# Patient Record
Sex: Female | Born: 1935 | Race: White | Hispanic: No | Smoking: Never smoker
Health system: Southern US, Community
[De-identification: ages and names within clinical notes are randomized; demographics above are authoritative.]

## PROBLEM LIST (undated history)

## (undated) DIAGNOSIS — E119 Type 2 diabetes mellitus without complications: Secondary | ICD-10-CM

## (undated) DIAGNOSIS — H543 Unqualified visual loss, both eyes: Secondary | ICD-10-CM

## (undated) DIAGNOSIS — K219 Gastro-esophageal reflux disease without esophagitis: Secondary | ICD-10-CM

## (undated) DIAGNOSIS — I10 Essential (primary) hypertension: Secondary | ICD-10-CM

## (undated) DIAGNOSIS — J449 Chronic obstructive pulmonary disease, unspecified: Secondary | ICD-10-CM

## (undated) DIAGNOSIS — N390 Urinary tract infection, site not specified: Secondary | ICD-10-CM

## (undated) DIAGNOSIS — M1990 Unspecified osteoarthritis, unspecified site: Secondary | ICD-10-CM

## (undated) DIAGNOSIS — E7800 Pure hypercholesterolemia, unspecified: Secondary | ICD-10-CM

## (undated) DIAGNOSIS — G459 Transient cerebral ischemic attack, unspecified: Secondary | ICD-10-CM

## (undated) DIAGNOSIS — I639 Cerebral infarction, unspecified: Secondary | ICD-10-CM

## (undated) HISTORY — PX: FRACTURE SURGERY: SHX138

## (undated) HISTORY — PX: CATARACT EXTRACTION W/ INTRAOCULAR LENS  IMPLANT, BILATERAL: SHX1307

## (undated) HISTORY — PX: RETINAL DETACHMENT SURGERY: SHX105

## (undated) HISTORY — PX: EYE SURGERY: SHX253

---
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
## 1999-06-14 ENCOUNTER — Inpatient Hospital Stay (HOSPITAL_COMMUNITY): Admission: AD | Admit: 1999-06-14 | Discharge: 1999-06-15 | Payer: Self-pay | Admitting: Endocrinology

## 1999-06-14 ENCOUNTER — Encounter: Payer: Self-pay | Admitting: Endocrinology

## 1999-06-23 ENCOUNTER — Ambulatory Visit (HOSPITAL_COMMUNITY): Admission: RE | Admit: 1999-06-23 | Discharge: 1999-06-23 | Payer: Self-pay | Admitting: Endocrinology

## 2003-03-15 HISTORY — PX: ERCP: SHX60

## 2003-03-15 HISTORY — PX: LAPAROSCOPIC CHOLECYSTECTOMY: SUR755

## 2003-03-22 ENCOUNTER — Encounter: Payer: Self-pay | Admitting: Emergency Medicine

## 2003-03-22 ENCOUNTER — Encounter: Payer: Self-pay | Admitting: Endocrinology

## 2003-03-22 ENCOUNTER — Inpatient Hospital Stay (HOSPITAL_COMMUNITY): Admission: EM | Admit: 2003-03-22 | Discharge: 2003-03-26 | Payer: Self-pay | Admitting: Emergency Medicine

## 2003-03-23 ENCOUNTER — Encounter: Payer: Self-pay | Admitting: Gastroenterology

## 2003-03-24 ENCOUNTER — Encounter (INDEPENDENT_AMBULATORY_CARE_PROVIDER_SITE_OTHER): Payer: Self-pay | Admitting: *Deleted

## 2003-03-24 ENCOUNTER — Encounter: Payer: Self-pay | Admitting: Endocrinology

## 2004-08-14 HISTORY — PX: ORIF ANKLE FRACTURE: SUR919

## 2004-08-30 ENCOUNTER — Inpatient Hospital Stay (HOSPITAL_COMMUNITY): Admission: EM | Admit: 2004-08-30 | Discharge: 2004-09-02 | Payer: Self-pay | Admitting: Emergency Medicine

## 2004-09-13 ENCOUNTER — Inpatient Hospital Stay (HOSPITAL_COMMUNITY): Admission: RE | Admit: 2004-09-13 | Discharge: 2004-09-16 | Payer: Self-pay | Admitting: Specialist

## 2004-10-14 HISTORY — PX: ORIF ANKLE FRACTURE BIMALLEOLAR: SUR920

## 2005-10-10 ENCOUNTER — Encounter: Payer: Self-pay | Admitting: Emergency Medicine

## 2005-10-11 ENCOUNTER — Inpatient Hospital Stay (HOSPITAL_COMMUNITY): Admission: AD | Admit: 2005-10-11 | Discharge: 2005-10-14 | Payer: Self-pay | Admitting: Endocrinology

## 2005-10-14 HISTORY — PX: INCISION AND DRAINAGE: SHX5863

## 2008-04-28 ENCOUNTER — Ambulatory Visit (HOSPITAL_COMMUNITY): Admission: RE | Admit: 2008-04-28 | Discharge: 2008-04-28 | Payer: Self-pay | Admitting: Ophthalmology

## 2010-03-14 HISTORY — PX: PARS PLANA VITRECTOMY: SHX2166

## 2010-03-17 ENCOUNTER — Ambulatory Visit (HOSPITAL_COMMUNITY): Admission: RE | Admit: 2010-03-17 | Discharge: 2010-03-17 | Payer: Self-pay | Admitting: Ophthalmology

## 2010-07-09 ENCOUNTER — Ambulatory Visit (HOSPITAL_COMMUNITY): Admission: RE | Admit: 2010-07-09 | Discharge: 2010-07-09 | Payer: Self-pay | Admitting: Ophthalmology

## 2010-10-14 HISTORY — PX: GLAUCOMA SURGERY: SHX656

## 2010-11-12 ENCOUNTER — Ambulatory Visit (HOSPITAL_COMMUNITY)
Admission: RE | Admit: 2010-11-12 | Discharge: 2010-11-12 | Payer: Self-pay | Source: Home / Self Care | Attending: Ophthalmology | Admitting: Ophthalmology

## 2011-01-24 LAB — SURGICAL PCR SCREEN
MRSA, PCR: NEGATIVE
Staphylococcus aureus: POSITIVE — AB

## 2011-01-24 LAB — BASIC METABOLIC PANEL
Calcium: 9.2 mg/dL (ref 8.4–10.5)
GFR calc Af Amer: 54 mL/min — ABNORMAL LOW (ref >60)
GFR calc non Af Amer: 45 mL/min — ABNORMAL LOW (ref >60)
Potassium: 4.2 mEq/L (ref 3.5–5.1)
Sodium: 136 mEq/L (ref 135–145)

## 2011-01-24 LAB — GLUCOSE, CAPILLARY
Glucose-Capillary: 231 mg/dL — ABNORMAL HIGH (ref 70–99)
Glucose-Capillary: 242 mg/dL — ABNORMAL HIGH (ref 70–99)
Glucose-Capillary: 276 mg/dL — ABNORMAL HIGH (ref 70–99)

## 2011-01-24 LAB — CBC
Hemoglobin: 12.5 g/dL (ref 12.0–15.0)
MCHC: 33.4 g/dL (ref 30.0–36.0)
RBC: 4.15 MIL/uL (ref 3.87–5.11)
WBC: 5.7 10*3/uL (ref 4.0–10.5)

## 2011-01-27 LAB — GLUCOSE, CAPILLARY: Glucose-Capillary: 220 mg/dL — ABNORMAL HIGH (ref 70–99)

## 2011-01-27 LAB — BASIC METABOLIC PANEL
BUN: 22 mg/dL (ref 6–23)
Calcium: 9.2 mg/dL (ref 8.4–10.5)
Chloride: 101 mEq/L (ref 96–112)
Creatinine, Ser: 1.1 mg/dL (ref 0.4–1.2)

## 2011-01-27 LAB — CBC
MCV: 89.8 fL (ref 78.0–100.0)
Platelets: 159 10*3/uL (ref 150–400)
RDW: 13 % (ref 11.5–15.5)
WBC: 7.2 10*3/uL (ref 4.0–10.5)

## 2011-01-27 LAB — APTT: aPTT: 32 seconds (ref 24–37)

## 2011-02-01 LAB — BASIC METABOLIC PANEL
BUN: 22 mg/dL (ref 6–23)
Chloride: 101 mEq/L (ref 96–112)
GFR calc Af Amer: >60 mL/min (ref >60)
Potassium: 3.5 mEq/L (ref 3.5–5.1)

## 2011-02-01 LAB — CBC
HCT: 40.9 % (ref 36.0–46.0)
MCV: 91.9 fL (ref 78.0–100.0)
RBC: 4.45 MIL/uL (ref 3.87–5.11)
WBC: 7.6 10*3/uL (ref 4.0–10.5)

## 2011-02-01 LAB — GLUCOSE, CAPILLARY
Glucose-Capillary: 162 mg/dL — ABNORMAL HIGH (ref 70–99)
Glucose-Capillary: 260 mg/dL — ABNORMAL HIGH (ref 70–99)

## 2011-03-29 NOTE — Op Note (Signed)
NAME:  Julia Oliver, Julia Oliver NO.:  [id number]   MEDICAL RECORD NO.:  [id number]          PATIENT TYPE:  AMB   LOCATION:  SDS                          FACILITY:  [facility]   PHYSICIAN:  Alford Highland. Rankin, M.D.   DATE OF BIRTH:  November 04, 1920   DATE OF PROCEDURE:  04/28/2008  DATE OF DISCHARGE:                               OPERATIVE REPORT   PREOPERATIVE DIAGNOSES:  1. Tractional detachment of the right eye.  2. Epiretinal membrane of the right eye.  3. Progressive proliferative diabetic retinopathy of the right eye.   POSTOPERATIVE DIAGNOSES:  1. Tractional detachment of the right eye.  2. Epiretinal membrane of the right eye.  3. Progressive proliferative diabetic retinopathy of the right eye.   PROCEDURES:  1. Posterior vitrectomy of epiretinal membrane and tractional      detachment of the right eye - 25 gauge.  2. Endolaser panphotocoagulation, right eye.  3. Injection vitreous substitute - SF6 20%.   SURGEON:  Alford Highland. Rankin, M.D.   ANESTHESIA:  Local with monitored anesthesia control.   INDICATION FOR PROCEDURE:  The patient is an 77 year old woman who has  profound vision loss in the right eye to the level of about 20/300s on  the basis of multifocal tractional detachment with two sites within the  macular region, and one immediately adjacent temporal to the fovea  involving the macula in addition to a shallow detachment adjacent to the  optic nerve.  The patient says it is an attempt to release the  tractional changes so as to allow healing, and the retina was flattened  to allow the best chance for visual acuity stabilization and hopefully  improvement.  The patient understands the risks of anesthesia  including  rate occurrence of death, loss of the eye including from the underlying  condition as well as surgical repair, but not limited to hemorrhage,  infection, scarring, need for another surgery, no change in vision, loss  of vision, and progressive disease  despite intervention.   PROCEDURE IN DETAIL:  Appropriate signed consent was obtained. The  patient was taken to the operating room.  In the operating room,  appropriate monitors followed by mild sedation.  Xylocaine 2% was  injected retrobulbar to the right eye, 5 mL with an additional 4 mL  injected laterally in the fashion of modified [name].  The right  periocular region was sterilely prepped and draped in the usual sterile  fashion.  A lid speculum was applied.  A 25-gauge trocar was placed on  the inferotemporal quadrant.  A superior trocar was applied.  Core  traction was then begun.  Modified en bloc technique with the small 25-  gauge vitrectomy allowed for removal of the dense fibrous tissue and  traction on the optic nerve and more importantly over the foveal macular  region.  Tractional changes __________ were also found.  Small retinal  holes throughout the base of the fibrous disease nasally.  The  dissection was then carried out with the posterior hyaloid and down to  the anterior to the equator trimming it 360 degrees.  Retrolental  hyaloid was  confirmed removed.  At this time, fluid air exchange was  completed.  The little small nasal retinal detachment settled nicely as  the fluid was extruded.  Endolaser photocoagulation placed in the bed of  the this localized detachment nasal to the optic nerve as well as  superiorly, and it has not been previously treated.  A  decision was made to place SF6 gas.  A 20% SF6 gas was exchanged for the  air.  Trocars were removed.  Subconjunctival Decadron applied.  Sterile  patch and Fox shield applied.  The patient tolerated the procedure well  without complications.      Alford Highland Rankin, M.D.  Electronically Signed     GAR/MEDQ  D:  04/28/2008  T:  04/28/2008  Job:  [id number]   cc:   Ladoris Gene, MD

## 2011-04-01 NOTE — H&P (Signed)
NAME:  Julia Oliver, KOOK NO.:  [id number]   MEDICAL RECORD NO.:  [id number]                   PATIENT TYPE:  EMS   LOCATION:  MAJO                                 FACILITY:  [facility]   PHYSICIAN:  Reather Littler, M.D.                    DATE OF BIRTH:  [birth year]   DATE OF ADMISSION:  03/22/2003  DATE OF DISCHARGE:                                HISTORY & PHYSICAL   CHIEF COMPLAINT:  Episodes of chest pain.   HISTORY:  This patient apparently was well until this afternoon, when she  experienced a discomfort in her lower chest across the whole chest which she  says was under her breast area.  She says that this pain was hurting in  nature and apparently it spread to her neck and her spine in the back.  The  patient said that she also experienced an aching feeling in the chest area.  She did not think she was having abdominal discomfort.  She tried to take  Tums but initially it may have helped a little.  Subsequently, the patient  started having nausea and vomited and the pain subsided.  Apparently, the  pain recurred three times with the same symptoms and today, she has had two  episodes of nausea and vomiting.  She claims that the pains lasted only two  to three minutes each time and then by the time she was transported to the  emergency room by ambulance, the pain had subsided.  She tried Tums a second  time for the pain but it did not relieve her.  She thinks she was also  having a difficult time breathing.  She did not have any sweating or  lightheadedness.  She has not had any chills or fever.  She has been able to  eat normally.  No change in her bowel movements.   The patient was not given any nitroglycerin.  She was apparently feeling  somewhat less short of breath with administration of oxygen by the EMS.   PAST MEDICAL HISTORY:  She has had no significant history except for  appendectomy.  She apparently had a hospital visit about five years ago  but  she did not know whether it was a light stroke.  Her medical record is not  available currently.   PERSONAL HISTORY:  She lives with her husband.  She is a nonsmoker.   FAMILY HISTORY:  Family history not contributory.   REVIEW OF SYSTEMS:  The patient has had diabetes for 38 years, taking  Diabinese for several years and for the last three years, taking insulin;  she currently takes 32 units in the morning and 16 in the evening of 75/25  insulin.  She also takes 81 mg aspirin and Zocor for hypercholesterolemia.  She has had no history of hypertension, CVA, MI.  She does not complain of  any numbness in her feet.  She has mild indigestion relieved by Tums after  her evening meal.   PHYSICAL EXAMINATION:  GENERAL:  The patient is heavily built and nourished.  VITAL SIGNS:  Her blood pressure is 180/78.  Her pulse is 96.  Temperature  is normal.  Respirations are normal.  HEENT:  Eyes:  There is no pallor.  Mucous membranes are slightly dry.  NECK:  No thyroid enlargement.  No carotid bruits.  HEART:  Heart sounds are normal.  LUNGS:  Lungs are clear.  ABDOMEN:  She has mild nonspecific epigastric and left lower quadrant  tenderness.  She has no spine tenderness or flank tenderness.  EXTREMITIES:  She has 1+ edema.   LABORATORY DATA:  Labs significant for increased ALT of 209 and AST of 489  and lipase of 69, bilirubin of 2.1.  Her electrolytes and white count are  normal.  Cardiac enzymes are normal.   EKG shows mild ST changes.   ASSESSMENT:  Patient appears likely to have had an episode of biliary colic  but possibly some pancreatitis.   PLAN:  The patient will need to have a diagnostic ultrasound of her right  upper quadrant and surgical consultation if positive.                                               Reather Littler, M.D.    AK/MEDQ  D:  03/22/2003  T:  03/24/2003  Job:  [id number]   cc:   Alfonse Alpers. Dagoberto Ligas, M.D.  [address], [address]  [place]  [place]  [address]  Fax: [phone]

## 2011-04-01 NOTE — Op Note (Signed)
NAME:  Julia Oliver, Julia Oliver NO.:  [id number]   MEDICAL RECORD NO.:  [id number]          PATIENT TYPE:  REC   LOCATION:  FOOT                         FAC[facility]   PHYSICIAN:  Jene Every, M.D.    DATE OF BIRTH:  1920-05-03   DATE OF PROCEDURE:  10/20/2004  DATE OF DISCHARGE:                                 OPERATIVE REPORT   PREOPERATIVE DIAGNOSIS:  Fracture dislocation of the right ankle.   POSTOPERATIVE DIAGNOSIS:  1.  Fracture dislocation of the right ankle.  2.  Venous stasis ulcer of the distal leg.   OPERATION/PROCEDURE:  1.  Irrigation and debridement of the venous stasis, decubitus ulcer of the      leg.  2.  Open reduction and internal fixation from bimalleolar ankle fracture.   ANESTHESIA:  General.   SURGEON:  Jene Every, M.D.   ASSISTANT:  Roma Schanz, P.A.-C.   INDICATIONS:  This is an 77 year old, status post closed reduction of a  bimalleolar fracture dislocation of the ankle.  She was in a splint for  reduction of soft tissue swelling.  Operative intervention is indicated for  internal fixation.  Risks and benefits discussed including bleeding,  infection, damage to vascular structures, no change in symptoms, worsening  of symptoms, hardware failure, need for revision, nonunion, malunion, etc.   TECHNIQUE:  With the patient in the supine position, after induction of  adequate general anesthesia and 1 gram of Kefzol, splint was removed.  The  leg was inspected.  There was a venous stasis __________ decubitus ulcer  above the medial malleolus felt to be from avulsion from the initial injury.  This was not near the fracture site.  I debrided the edges of this,  approximately 2 x 1 cm, debrided the edges to bleeding tissue.  There was no  exposed bone.  I covered it with a 4 x 4.   Next, due to comminution of the distal fibula, we made a small incision over  the distal aspect of the fibula and utilizing a Rush rod, intramedullary  to  hold the fracture and prevent lateral translation.  Therefore, used a 4 mm  Rush rod, drilled a hole in the distal fibula and advanced it intramedullary  into the fibula with good reduction of the fibula fracture.  Next, in an  effort to avoid the decubitus wound or the venous stasis wound over the  medial malleolus, I reduced it closed.  The medial malleolus held with a  tenaculum, placed two percutaneous incisions, and then advanced two  partially threaded cancellous screws up in the medial malleolus with  anatomic reduction.  After __________ drilling, depth gauge measurement,  insertion of the screws, appeared excellent purchase was obtained despite  osteoporotic bone.  Both wounds were copiously irrigated and I closed each  with 4-0 nylon simple sutures.  Under x-ray, there was restoration of the  mortise in the AP and lateral plane.  The decubitus was dressed sterilely  Xeroform, 4 x 4's.  Placed her into a short-leg cast, well molded.  Tourniquet was not utilized.  She was placed in a short-leg, well  padded  cast in the neutral position.  The patient was then awakened without  difficulty and transported to the recovery room.  The patient tolerated the  procedure well. There were no complications.     Trey Paula   JB/MEDQ  D:  10/20/2004  T:  10/21/2004  Job:  [id number]

## 2011-04-01 NOTE — Op Note (Signed)
NAME:  Julia Oliver, Julia Oliver NO.:  [id number]   MEDICAL RECORD NO.:  [id number]                   PATIENT TYPE:  INP   LOCATION:  5024                                 FACILITY:  [facility]   PHYSICIAN:  Bernette Redbird, M.D.                DATE OF BIRTH:  1920/05/06   DATE OF PROCEDURE:  03/23/2003  DATE OF DISCHARGE:                                 OPERATIVE REPORT   PROCEDURE:  ERCP (attempted).   INDICATION:  An 77 year old female, who presented to the hospital with upper  abdominal pain radiating through to her back, multiple small gallstones on  ultrasound, a borderline enlarged common bile duct, and elevated liver  chemistries which have risen overnight.   FINDINGS:  Unable to cannulate the common bile duct.  The major papilla was  very small.  A guidewire was able to be passed into the pancreatic duct.   DESCRIPTION OF PROCEDURE:  The nature, purpose, and risks of the procedure  had been carefully discussed with the patient and her daughter, and she  provided written consent.  She was brought from her hospital room to the  endoscopy unit following intravenous Rocephin 1 g as antibiotic prophylaxis.  Sedation prior to and during the course of the exam totaled fentanyl 70 mcg  and Versed 7 mg IV.  No Glucagon was administered.  The patient's CBG prior  to the procedure was 158 (she is diabetic).   The Olympus new small-caliber therapeutic video duodenoscope was passed  easily into the esophagus blindly and advanced down into the stomach which  had a normal appearance except for a small hiatal hernia seen on retroflexed  viewing.  Specifically, I saw no evidence of gastritis, erosions, ulcers,  polyps, or masses on a fairly complete view of the stomach.   The stomach did appear to be somewhat enlarged, and it was difficult to get  the scope to traverse the pylorus, but this was finally accomplished after  several minutes of trying.   It was then  somewhat difficult to get the scope to advance down into the  duodenum because of looping in the stomach.  By shifting the patient from  the prone position to the prone oblique position and ultimately the left  lateral decubitus position, I was finally able to get fairly good intubation  of the duodenum but still could not identify the major papilla.   Finally, we placed the patient into the supine position and with careful  attention to mouth suctioning because she had a fair amount of secretions,  burping, and some regurgitation (but no evidence of aspiration), I was able  to get a somewhat better orientation and view of the second portion of the  duodenum.   I was able to identify what I believe was the minor papilla and then this  guided me to the major papilla.  The major papilla was barely visible as a  minimal  elevation of mucosa around the surrounding mucosa with a hint of the  characteristic longitudinal fold, but certainly this was not at all  prominent.  There was certainly no punctum, nor any bile in the duodenal  lumen nor any drainage of bile from this area during the course of the  procedure.  Nevertheless, we attempted blind cannulation and after a period  of trying, we were able to get the guidewire to advance, and fluoroscopy  showed this to cross the midline, compatible with entry into the pancreatic  duct.  Since there was no indication for a pancreatogram, instillation of  contrast was not performed.  We then made some further efforts to obtain  cannulation of the common bile duct, but these were not successful.  Ultimately, since the procedure had already been going on about an hour and  10 minutes and I was concerned about the possibility of aspiration with the  patient in the supine position, and since it was felt that there really was  not a sufficient sphincter muscle anatomically present to permit a generous  sphincterotomy for stone extraction anyway, that  further attempts at  cannulation of the common duct would probably not be helpful to the patient  and might increase the risk of complications such as pancreatitis, so the  procedure was terminated at this point.  Prior to removal of the scope, I  suctioned additional fluid out of the stomach.  The patient was then placed  back into the left lateral decubitus position.  In recovery, she was stable  without distress.  She tolerated the procedure overall very well, and there  were no apparent complications.   IMPRESSION:  1. Very small major papillae, as described above.  2. Pancreatic duct cannulated with guidewire but not injected with contrast.  3. Unable to cannulate the common bile duct despite prolonged efforts, as     described above.   RECOMMENDATIONS:  Due to the tiny size of this patient's major papillae, I  do not believe she is a good sphincterotomy candidate.  Even if cannulation  could be achieved (for example, by leaving a guidewire down the common duct  during surgery, or by further attempts at standard ERCP), I do not think  that a sufficient cut could be made on the end of the bile duct to allow  extraction of any significant sized stones.  Therefore, I would favor  proceeding to operative management including, if necessary, an open  cholecystectomy with common bile duct exploration.   I have discussed these findings with Jimmye Norman, M.D. and with Reather Littler,  M.D. as well as with the patient's daughter.                                               Bernette Redbird, M.D.    RB/MEDQ  D:  03/23/2003  T:  03/24/2003  Job:  [id number]   cc:   Reather Littler, M.D.  [address]  [place]  [place] [address]  Fax: [phone]   Jimmye Norman III, M.D.  [phone] [address]  [place]  [place] [address]  Fax: [phone]

## 2011-04-01 NOTE — Consult Note (Signed)
NAME:  Julia Oliver, QUIZON NO.:  [id number]   MEDICAL RECORD NO.:  [id number]                   PATIENT TYPE:  INP   LOCATION:  5024                                 FACILITY:  [facility]   PHYSICIAN:  Bernette Redbird, M.D.                DATE OF BIRTH:  May 23, 1920   DATE OF CONSULTATION:  03/23/2003  DATE OF DISCHARGE:                                   CONSULTATION   Dr. Lucianne Muss, covering for Dr. Dagoberto Ligas, asked me to see this 77 year old female  because of biliary tract disease.   HISTORY:  The patient was admitted to the hospital yesterday after an  episode of severe xyphoid region epigastric pain radiating through to the  back which began yesterday afternoon.  She had similar milder episodes in  the past, but none as severe as this.  She had vomiting.  No definite fevers  or chills, possibly some dark urine.  Ultrasound in the emergency room  showed cholelithiasis and a 6.3 mm common duct without definite  choledocholithiasis and without gallbladder wall thickening, but the patient  had significant elevation of liver chemistries (see below), minimal  elevation of lipase and accordingly I was asked to see the patient.   There is a family history of gallbladder disease in her mother.   The patient is a longstanding diabetic.   ALLERGIES:  1. NOVOCAINE (Apparently passed out during dental work, doubt it was really     due to the Novocaine, this was many years ago).  2. PENICILLIN (reaction).  3. SULFA DRUGS (GI upset).   OUTPATIENT MEDICATIONS:  1. Humalog insulin 75/25 32 units each  morning and 16 units each evening.  2. Zocor.  3. Daily baby aspirin.   OPERATIONS:  Remote appendectomy, cataract surgery.   CHRONIC MEDICAL ILLNESSES:  Type 2 diabetes of 30 years duration, on insulin  for the past five years or so.  No known cardiopulmonary disease or  hypertension.   HABITS:  Lifelong nonsmoker, nondrinker.   FAMILY HISTORY:  Gallbladder disease in  her mother.  Colon cancer and ulcers  in her sister.  The colon cancer was diagnosed at an age close to where the  patient is now.   SOCIAL HISTORY:  The patient is married.  A daughter,  Julia Oliver, accompanies  her today.   REVIEW OF SYSTEMS:  Chronic constipation, heartburn each day around 5 p.m.  treated with over-the-counter antacids, intermittent dysphagia, no anorexia,  weight loss, chronic stomach pain.  Occasional diarrhea.   PHYSICAL EXAMINATION:  GENERAL:  Pleasant healthy appearing female with  somewhat sallow complexion, alert and in no evident distress. Note, at this  time she has mild residual abdominal  soreness, but no ongoing pain.  VITAL SIGNS:  Temperature 98.2, heart rate 89, blood pressure 173/105 at  admission, respirations 18 and unlabored.  HEENT:  Mild conjunctival pallor.  No frank jaundice.  Oropharynx benign.  CHEST:  Clear  to auscultation.  HEART:  No gallops, rubs or murmurs, clicks or arrhythmias appreciated.  ABDOMEN:  Adipose.  Soft bowel sounds.  No organomegaly.  Mild tenderness in  the right lower abdominal region.  No peritoneal findings.  No mass affect.  No guarding.  No significant right upper quadrant tenderness.  RECTAL:  Not performed.   LABORATORY DATA:  White count 7800, hemoglobin 13.4 on admission, MCV 90,  RDW normal, platelets slightly low at 121,000, 90 polys, 5 lymphs, 5  monocytes.  INR 0.9.  Chemistry panel pertinent for glucose in the 230 to  280 range, renal function normal, bilirubin was 2.1 last night and is 2.9  this morning, alk phos was 94 last night and is 100 this morning, AST was  489 last night and is 585 today,  and ALT is 209 last night and 380 this  morning.  Last night, lipase was 69 and amylase was 96.  CK total was normal  and troponin I was negative.   The ultrasound report indicates multiple small gallstones in a 6.3 mm common  duct without definite choledocholithiasis or evidence of gallbladder wall   thickening.   IMPRESSION:  1. Probable choledocholithiasis based on elevated liver chemistries     (baseline liver chemistries not available).  2. Chololithiasis.  3. Abdominal pain, probably due to other #1 or #2.  4. No definite evidence of pancreatitis.  Minimal elevation of lipase with     normal amylase.  It is probably not likely significant.  5. Family history of colon cancer in her sister.  6. Tendency towards constipation.   PLAN:  1. ERCP today because of the rising liver chemistries.  The nature, purpose,     risks and alternatives have been very carefully discussed with the     patient and her daughter with the aid of a diagram and they are agreeable     to proceed.  2. Surgical consultation for consideration of cholecystectomy in view of the     documented chololithiasis, abdominal     symptoms and the fact that the patient is diabetic.  3. Consideration for elective outpatient colon cancer screening of some sort     in view of a family history of colon cancer.                                                 Bernette Redbird, M.D.    RB/MEDQ  D:  03/23/2003  T:  03/25/2003  Job:  [id number]   cc:   Alfonse Alpers. Dagoberto Ligas, M.D.  [address], [address]  [place]  [place] [address]  Fax: [phone]   Jimmye Norman III, M.D.  [phone], [address]  [place]  [place] [address]  Fax: [phone]

## 2011-04-01 NOTE — Discharge Summary (Signed)
NAME:  Julia Oliver, Julia Oliver NO.:  [id number]   MEDICAL RECORD NO.:  [id number]          PATIENT TYPE:  INP   LOCATION:  0480                         FACILITY:  [facility]   PHYSICIAN:  Jene Every, M.D.    DATE OF BIRTH:  02/28/1920   DATE OF ADMISSION:  09/13/2004  DATE OF DISCHARGE:  09/16/2004                                 DISCHARGE SUMMARY   ADMISSION DIAGNOSES:  1.  Fracture dislocation of right ankle.  2.  Insulin-dependent diabetes with poor control.  3.  Hypercholesterolemia.  4.  Remote history of transient ischemic attack.   DISCHARGE DIAGNOSES:  1.  Status post open reduction internal fixation of the right ankle;      fracture dislocation of right ankle.  2.  Insulin-dependent diabetes with poor control.  3.  Hypercholesterolemia.  4.  Remote history of transient ischemic attack.  5.  Asymptomatic postoperative anemia.  6.  Resolved hyponatremia.   PROCEDURE:  The patient was taken to the OR, underwent percutaneous open  reduction internal fixation of the right ankle by surgeon Dr. Jene Every,  assistant Roma Schanz, P.A. Also debridement of the ulcer on the  median aspect. Complications none. Anesthesia general.   CONSULTS:  PT/OT, Dr. Corrin Parker.   HISTORY:  Julia Oliver is an 77 year old female who fell at home October 17.  She was initially seen in the emergency room where Dr. Shelle Iron performed a  close reduction of her ankle fracture. She was admitted for a couple of days  and then released to a nursing facility to allow for soft tissue swelling to  decrease. We then saw her in the office. We discussed ORIF of the ankle. The  risks and benefits were discussed with the patient as well as the family,  and she wishes to proceed.   LABORATORY DATA:  CBC done postoperatively shows hemoglobin of 9.3,  hematocrit 27.9. This was followed during the hospital course. At the time  of discharge, hemoglobin 9.2, hematocrit 27.4 with a normal  white cell  count. Routine chemistries done showed sodium 135, potassium 4.1, glucose  142. These were followed during the hospital course. The patient's glucose  did stabilize to a level of 67. At the time of discharge, she did have a  slight drop in her sodium to 134.   HOSPITAL COURSE:  The patient was admitted, taken to the OR, and underwent  the above stated procedure without significant difficulty. She was then  transferred to the PACU and then to the orthopedic floor for continued  postoperative care. Postoperatively, her medical physician was consulted to  control her diabetes as well as wound care and diabetic teaching. During  surgery, the patient did have an area of skin breakdown on the medial aspect  of the ankle. This was debrided intraoperatively. Wound care was consulted  to manage her wound. Postoperatively, the patient did well. She had a slight  drop in her hemoglobin; however, remained asymptomatic from this. Wound  remained clean and dry. Decreased erythema around the area of concern with  decreased edema. The patient was placed in a cam walker, non weight  bearing  following surgery. Motor and neurovascular function remained intact. On  postoperative day #3, it was felt that patient was stable to be discharged  to a nursing facility of choice.   DISPOSITION:  The patient discharged to a nursing facility.   DISCHARGE INSTRUCTIONS:  The patient needs to follow up with Dr. Shelle Iron in  approximately 10 days for wound check and suture removal. She will need  outpatient care, possibly at a diabetic foot clinic for continued management  of her wound along the medial aspect. At the nursing facility, she will need  wet-to-dry dressing changes daily. If they have a wound care specialist on  hand, that would be of great benefit. Should continue non weight bearing to  the right lower quadrant. Periodic ice and elevation. She will need to be in  the cam walker. Diet should be low  carb, 1,800 calorie, strict close  monitoring of her insulin-dependent diabetes.   DISCHARGE MEDICATIONS:  1.  Humalog 75/25 28 units a.c. breakfast, 14 units a.c. at supper.  2.  Zocor 40 mg 1 p.o. q.a.m.  3.  Lasix 20 mg 1 p.o. q.a.m.  4.  Aspirin 81 mg 1 p.o. daily.  5.  Vicodin 1 to 2 p.o. q.4-6h. p.r.n. pain.  6.  Laxative or enema of choice.  7.  Colace 100 mg 1 p.o. b.i.d.  8.  Trinsicon 1 p.o. t.i.d. for postoperative anemia. This may need to be      followed while she is an inpatient at the nursing facility.   It there are any questions or concerns, they can call Dr. Shelle Iron at the  office [phone].   The patient will need to be in knee-high TEDS on the left lower extremity  for DVT prophylaxis.   CONDITION ON DISCHARGE:  Stable.   FINAL DIAGNOSIS:  Status post open reduction internal fixation of the right  ankle.     Chri   CS/MEDQ  D:  09/16/2004  T:  09/16/2004  Job:  [id number]

## 2011-04-01 NOTE — Discharge Summary (Signed)
NAME:  Julia Oliver, Julia Oliver NO.:  [id number]   MEDICAL RECORD NO.:  [id number]          PATIENT TYPE:  INP   LOCATION:  5729                         FACILITY:  [facility]   PHYSICIAN:  Alfonse Alpers. Gegick, M.D.DATE OF BIRTH:  14-Apr-1920   DATE OF ADMISSION:  10/11/2005  DATE OF DISCHARGE:  10/14/2005                                 DISCHARGE SUMMARY   HISTORY:  This is an 77 year old woman who presents with a history of  discomfort, vomiting, dehydration, back pain and dysuria.  Her symptoms have  been gradually increasing over a period of two weeks.  She presented to the  emergency room.  She had a temperature of 101.8 degrees and a blood pressure  of 103/46.  She had a white count of 13,900, potassium 2.3, glucose 225, and  she had a urine specimen which showed increased white cells.   PHYSICAL EXAMINATION:  LUNGS:  Clear.  CARDIOVASCULAR:  Rhythm regular.  ABDOMEN:  Soft, mild tenderness in the periumbilical area.  EXTREMITIES:  No edema.   IMPRESSION:  1.  Diabetes mellitus.  2.  Urinary tract infection.  3.  Dehydration.   HOSPITAL COURSE:  She was admitted to the hospital and was started on IV  fluids, IV antibiotics.  Her laboratory studies included a blood culture  which later showed gram-negative rods.  She had a dramatic improvement over  a period of 24 hours.  She felt significantly better.  She was treated with  Secre-Flo intravenously and then switched to oral medications.  In view of  the fact that she was doing so well on the oral medications, when her  culture results came back, she was continued on that.  Her glucoses were  gradually increasing in the 200 range.  Her usual insulin dose was  increased, and she improved with her sugar of 170 at the time of her  discharge.   DISPOSITION:  She was then discharged.   DISCHARGE DIAGNOSES:  1.  Gram-negative sepsis, secondary to a urinary tract infection.  2.  Diabetes mellitus type 2, requiring insulin.  3.  Status post transient ischemic attack.  4.  History of dyslipidemia.  5.  History of hypertension.   DISCHARGE MEDICATIONS:  1.  Secre-Flo 250 mg one b.i.d.  2.  Insulin 75/25 Humalog mix, 36 units in the a.m. and 20 units in the      evening.  3.  Enteric-coated aspirin 81 mg, one q.a.m.  4.  Zocor 40 mg, one q.p.m.  5.  Lasix 20 mg, one daily.  6.  Atacand 8 mg, one daily.   DIET:  Will be carbohydrate-restricted diet.   ACTIVITY:  As tolerated.   FOLLOWUP:  She will be seen in the office in a period of five days.   CONDITION ON DISCHARGE:  Improved.           ______________________________  Alfonse Alpers Dagoberto Ligas, M.D.     CGG/MEDQ  D:  10/14/2005  T:  10/14/2005  Job:  [id number]

## 2011-04-01 NOTE — Discharge Summary (Signed)
   NAME:  Julia Oliver, MARICH NO.:  [id number]   MEDICAL RECORD NO.:  [id number]                   PATIENT TYPE:  INP   LOCATION:  5024                                 FACILITY:  [facility]   PHYSICIAN:  Alfonse Alpers. Dagoberto Ligas, M.D.             DATE OF BIRTH:  09/15/1920   DATE OF ADMISSION:  03/22/2003  DATE OF DISCHARGE:  03/26/2003                                 DISCHARGE SUMMARY   HISTORY:  This is an 77 year old woman who presented to the emergency room  with abdominal pain.  She had abnormal liver function studies and a  diagnosis of cholecystitis was made.  She also has a history of diabetes  mellitus.   IMPRESSION ON ADMISSION:  1. Acute cholecystitis.  2. History of diabetes.   HOSPITAL COURSE:  She was admitted to the hospital.  She did have an  elevated bilirubin at 2.9.  She had consultation with the GI service.  It  was their impression that she had choledocholithiasis.  She had ERCP done.  Surgical consultation was also obtained.  Once she was stabilized, she had a  laparoscopic cholecystectomy.  She then felt significantly better.  She did  have some moderately elevated enzymes that persisted; however, these  improved subsequently.  She was then discharged.   IMPRESSION ON DISCHARGE:  1. Acute cholecystitis secondary to choledocholithiasis.  2. Diabetes mellitus type 2 requiring insulin.  3. History of dyslipidemia.   DISCHARGE MEDICATIONS:  She will be continuing to take her:  1. Humulin insulin 32 units in the morning and 16 in the evening.  2. Zocor.  3. Enteric-coated aspirin.   DISCHARGE DIET:  Diabetic diet.   DISCHARGE ACTIVITY:  As tolerated.   FOLLOW UP:  She will be seen in the office in two weeks.   CONDITION ON DISCHARGE:  Improved.                                               Alfonse Alpers. Dagoberto Ligas, M.D.    CGG/MEDQ  D:  05/06/2003  T:  05/07/2003  Job:  [id number]

## 2011-04-01 NOTE — Discharge Summary (Signed)
NAME:  JALAYNA, JOSTEN NO.:  [id number]   MEDICAL RECORD NO.:  [id number]          PATIENT TYPE:  INP   LOCATION:  0484                         FACILITY:  [facility]   PHYSICIAN:  Jene Every, M.D.    DATE OF BIRTH:  04-07-1920   DATE OF ADMISSION:  08/30/2004  DATE OF DISCHARGE:  09/02/2004                                 DISCHARGE SUMMARY   ADMISSION DIAGNOSES:  1.  Insulin-dependent diabetes.  2.  Hypercholesterolemia.  3.  Dislocated right ankle with tibiofibular fracture.  4.  History of transient ischemic attack.   DISCHARGE DIAGNOSES:  1.  Status post closed reduction of right ankle fracture.  2.  Insulin-dependent diabetes.  3.  Hypercholesterolemia.  4.  History of transient ischemic attack.   CONSULTS:  1.  Physical therapy.  2.  Occupational therapy.  3.  Alfonse Alpers. Dagoberto Ligas, M.D.   HISTORY:  Ms. Carbone is an 77 year old female who fell at her home today.  She noted immediate deformity of the right ankle.  She was brought to [place]  following emergency room for evaluation.  Dr. Shelle Iron did see her down in the  emergency room and reduced her ankle dislocation.  She then was admitted for  pain control, as well as edema control, and placement issues.   LABORATORY DATA:  Admission laboratories showed a sodium of 133, potassium  3.7, glucose 343, BUN 16 and creatinine 1.0.  This was followed throughout  the hospital course.  The patient __________ 134 and potassium 4.2 with  resolution of her glucose toxicity.  At the time of discharge, the level was  118.  Routine liver function tests showed a slightly elevated total  bilirubin at 1.5.  The CBC done at the time of admission showed a normal  white cell count of 7.7, hemoglobin 12.3 and hematocrit 36.7.  The admission  EKG showed a normal sinus rhythm with ST and T wave abnormalities and  questionable anterolateral ischemia.  X-ray of the ankle showed a  trimalleolar ankle fracture with lateral dislocation  of the talus and  approximately 90 degrees rotation of the foot laterally.  Closed reduction  films showed anatomic alignment with noted trimalleolar fracture.   HOSPITAL COURSE:  The patient was admitted.  She was taken to the orthopedic  floor for post reduction care, as well as pain control issues.  The patient  did well with good control of her edema, as well as pain.  She did consult  Dr. Dagoberto Ligas due to continued elevation of her glucose level.  He managed this  accordingly, adjusting her insulin as needed.  The patient was treated with  a couple of days of vancomycin secondary to abrasion from her dislocation.  During the hospital stay, motor and neurovascular exams remained intact.  Capillary refill was brisk.  We did consult wound care to manage her fungal  toenails, however, they did not address this in the hospital.  It was  recommended that she receive a podiatry consult on an outpatient basis.  Aspirin was started for DVT prophylaxis.  It was felt the patient was stable  and could be  discharged to a skilled nursing facility for continued care.   DISPOSITION:  Discharged to skilled nursing facility for continued care in  regards to her diabetes, as well as the right lower extremity.  The patient  will need close monitoring of this while at the nursing facility.  She  should follow back up with Dr. Shelle Iron in approximately one week for wound  check and scheduling of ORIF of the ankle.  They are asked to keep the right  lower extremity elevated with ice.  Skin checks daily.  If podiatry is on  staff at the nursing facility, she does need treatment for her fungal  toenails which seem to be growing into her skin on the right side.   DISCHARGE MEDICATIONS:  1.  Humalog mix 25 32 units in a.m. and 16 units in p.m.  2.  Aspirin 81 mg one p.o. daily.  3.  Zocor 40 mg one p.o. daily.  4.  Lasix 20 mg one p.o. daily.  5.  Vicodin 500 mg one to two p.o. q.4-6h. p.r.n. pain.   SPECIAL  INSTRUCTIONS:  The patient will need TED stocking on the left lower  extremity for DVT prophylaxis.  She also needs to continue her incentive  spirometer.   DIET:  Low carbohydrate, 1800 calorie.   ACTIVITY:  She should be nonweightbearing on the right lower extremity.   CONDITION ON DISCHARGE:  Stable.     Chri   CS/MEDQ  D:  09/02/2004  T:  09/02/2004  Job:  [id number]

## 2011-04-01 NOTE — Op Note (Signed)
NAME:  Julia Oliver, Julia Oliver NO.:  [id number]   MEDICAL RECORD NO.:  [id number]                   PATIENT TYPE:  INP   LOCATION:  5024                                 FACILITY:  [facility]   PHYSICIAN:  Jimmye Norman III, M.D.               DATE OF BIRTH:  09-14-1920   DATE OF PROCEDURE:  03/24/2003  DATE OF DISCHARGE:                                 OPERATIVE REPORT   PREOPERATIVE DIAGNOSES:  Cholelithiasis, possible choledocholithiasis and  abnormal liver function tests with hyperbilirubinemia.   POSTOPERATIVE DIAGNOSES:  Cholelithiasis, possible choledocholithiasis and  abnormal liver function tests with hyperbilirubinemia.  Abnormal  cholangiogram.   OPERATION PERFORMED:  1. Laparoscopic cholecystectomy.  2. Intraoperative cholangiogram.   SURGEON:  Marta Lamas. Lindie Spruce, M.D.   ASSISTANT:  None.   ANESTHESIA:  General endotracheal.   ESTIMATED BLOOD LOSS:  Less than 50mL.   COMPLICATIONS:  None.   CONDITION:  Stable.   INDICATIONS FOR PROCEDURE:  The patient is an 77 year old with abdominal  pain, right upper quadrant, and inability to get a preoperative ERCP, who  comes in with cholelithiasis, cholecystitis.   OPERATIVE FINDINGS:  At the time of surgery, the patient had definite  evidence of acute cholecystitis in addition to having a very swollen and  partially obstructing distal common bile duct which allowed only as small  amount of trickle flow into the duodenum.  There was no evidence of a  meniscus sign or a stone in the duct; however, we could not get free flow  into the duodenum.   DESCRIPTION OF PROCEDURE:  The patient was taken to the operating room and  placed on the table in the supine position.  After an adequate endotracheal  anesthetic was administered, the patient was prepped and draped in the usual  sterile manner exposing the midline and her right upper quadrant.   A supraumbilical curvilinear incision was made using a #11 blade.   It was  taken down to the midline fascia through which a Veress needle was passed  into the peritoneal cavity and confirmed to be in position with the saline  test.  Once this was done, carbon dioxide insufflation was instilled into  the peritoneal cavity up to a maximal pressure of 15 mmHg.  We then passed  an 11-12 mm cannula into the peritoneal cavity and confirmed its position  using a laparoscope with attached camera and light source.  Once this  cannula was in, two right costal margin 5 mm cannulas and a subxiphoid 11-12  mm cannula were placed in the peritoneal cavity under direct vision.  The  patient was placed in reverse Trendelenburg position.  The left side was  tilted down and the dissection begun.  The gallbladder was tense and  thickened and there were inflammatory adhesions to the infundibulum.  We  retracted towards the anterior abdominal wall and the right upper quadrant  using a large ratcheted grasper  through the lateral most 5 mm cannula.  We  then dissected away the inflammatory adhesions from the infundibulum  exposing it better.  We were also able to expose the peritoneum overlying  the triangle of Calot in the hepatoduodenal triangle.  It was in this area  that windows were made around the cystic duct and the cystic artery in order  to perform the cholangiogram and also to ligate the artery.  With an  adequate window made around the cystic duct.  We clipped it along the  gallbladder side and made a cholecystodochotomy through which a Reddick  catheter which had been passed through the abdominal wall  was passed into  the cholecystodochotomy.  It was through this that we did the cholangiogram  which demonstrated flow into both the proximal and distal common bile duct  into the proximal common hepatic duct with a small amount of trickle flow  into the duodenum initially and reflux into the pancreatic duct.  No free  flow into the duodenum.  We could not redemonstrate  the flow into the  duodenum after giving the patient 1 mg of Glucagon and it appeared to be  somewhat stagnant.  We removed the clip on the catheter to remove it.  Upon  completing the cholangiogram, the bile and fluid came back under pressure as  it had not been completely emptied.   We did not see any definite evidence of stones in the cholangiogram.  There  was no meniscal sign.  There appeared to be a very sharp taper which may  represent inflammation.  Because of this, we elected not to open the  patient.  Once the cholangiogram catheter was removed, we clipped the cystic  duct with three endo clips.  We subsequently transected it, the cystic  artery.  We then dissected the gallbladder out of its bed with minimal  difficulty.  We then brought it out through the supraumbilical site using an  EndoCatch bag. There was minimal difficulty.  We did have to expand the  incision somewhat.   Once the gallbladder was removed, we irrigated with almost a 1.5L to 2L of  warm saline solution.  We then removed our cannulas, aspirated all gas and  fluid, felt there was adequate hemostasis in the liver bed which had been  cauterized.  The supraumbilical fascia was closed using a figure-of-eight  stitch of 0 Vicryl passed on a UR-6 needle.  We then injected 25% Marcaine  with epinephrine at all sites and then closed the skin using running  subcuticular stitch of 4-0 Vicryl.  Steri-Strips were applied to the wounds  along with sterile dressings.  All sponge, needle and instrument counts were  correct at the conclusion of the case.                                                 Kathrin Ruddy, M.D.    JW/MEDQ  D:  03/24/2003  T:  03/25/2003  Job:  [id number]   cc:   Reather Littler, M.D.  [address]  [place]  [place] [address]  Fax: [phone]   Bernette Redbird, M.D.  [address]  [place], [place] [address]  Fax: [phone]

## 2011-04-01 NOTE — H&P (Signed)
NAME:  Julia Oliver, Julia Oliver NO.:  [id number]   MEDICAL RECORD NO.:  [id number]          PATIENT TYPE:  INP   LOCATION:  5729                         FACILITY:  [facility]   PHYSICIAN:  Reather Littler, M.D.       DATE OF BIRTH:  [birth year]   DATE OF ADMISSION:  10/11/2005  DATE OF DISCHARGE:                                HISTORY & PHYSICAL   The patient admitted at [facility] emergency room but transferred to [facility].   CHIEF COMPLAINT:  Vomiting.   HISTORY:  This is an 77 year old diabetic patient who presents to the  emergency room by ambulance with continued complaints of weakness and  nausea. The patient says that she has had vomiting off and on and because of  her vomiting this afternoon and continued weakness that she was brought to  the emergency room. She has also had fever for at least a week which has  been generally low-grade and not associated with chills. The patient has not  had any cough or respiratory symptoms. She does describe some burning with  urination. She is also having discomfort with urination and she thinks that  at one point she thinks she passed a kidney stone because of the progressive  pain down her abdomen and painful urination associated with that. Has not  noticed any blood in the stool.   The patient has not been seen for this continued fever and problems with  nausea. She also has had transient diarrhea which has not been present  today. She has been able to drink water but has not been able to eat much.   She has been taking insulin for her diabetes and she thinks that the blood  sugars have been over 200 recently. She apparently had called about a week  ago with blood sugars over 300 and 400 at one time.   PAST HISTORY:  She has had cholecystectomy. She has had right ankle injury  and also ulcer of the right malleolus in the past recently. She had an  episode of TIA in 2000.   MEDICATIONS:  1.  She is on insulin which is  Humalog mix 75/25 taking 30 units in the      morning and 16 in the evening at suppertime.  2.  She also takes Zocor 40.  3.  Lasix 20.  4.  Atacand 8 mg.  5.  She has been prescribed Fosamax which apparently has not been taking.   ALLERGIES:  She is allergic to PENICILLIN and SULFA and intolerant to  NOVOCAIN.   PERSONAL HISTORY:  She lives with her husband. She is a nonsmoker.   REVIEW OF SYSTEMS:  She has had diabetes since 1972. Her control has been  fairly poor with the last A1c of 9%. She has had history of retinopathy with  some decreased vision. No history of nephropathy or peripheral neuropathy.  She has had a history of mild hypertension and history of  hypercholesterolemia for which she has been taking Zocor. There is a history  of pedal edema previously and also a goiter. She has had known  osteoporosis.   PHYSICAL EXAMINATION:  GENERAL:  The patient is a pale-looking elderly lady  in no acute distress. She is lying comfortably in bed.  VITAL SIGNS:  Her pulse is 82, blood pressure is 103/46. Temperature  initially on arrival in the emergency room was 101.8 degrees.  HEENT:  Eyes:  She has __________ pupils. ENT exam shows very dry mucous  membranes.  NECK:  Shows no thyroid enlargement or lymphadenopathy, no carotid bruits.  HEART SOUNDS:  Normal.  LUNGS:  Clear.  BREAST:  Not indicated.  ABDOMEN:  Not distended, bowel sounds normal. No tenderness or mass.  However, she has some right flank tenderness.  EXTREMITIES:  She has no edema or skin lesions. She has a deformed right  ankle. Pedal pulses are normal. Distal sensation is normal.   LABORATORY DATA:  Significant for potassium of 2.3, creatinine of 1.8,  compared to a creatinine of 1.0 in the office previously. White count of  13,900. Glucose of 225.   ASSESSMENT:  This patient probably has urinary tract infection with possible  nephritis. She is also significantly dehydrated with mild renal dysfunction  and  severe hypokalemia. She also has hyperglycemia not well managed at home.   The plan would be to hydrate her and replace potassium. She has been started  by the ER physician on Cipro and this will be continued pending the  cultures. She will also be given a modest dose of insulin as she will  probably improve with hydration and treatment of infection.           ______________________________  Reather Littler, M.D.     AK/MEDQ  D:  10/11/2005  T:  10/11/2005  Job:  [id number]

## 2011-04-01 NOTE — Op Note (Signed)
NAME:  Julia Oliver, KAGE NO.:  [id number]   MEDICAL RECORD NO.:  [id number]          PATIENT TYPE:  OBV   LOCATION:  0480                         FACILITY:  [facility]   PHYSICIAN:  Jene Every, M.D.    DATE OF BIRTH:  07-16-1920   DATE OF PROCEDURE:  09/13/2004  DATE OF DISCHARGE:                                 OPERATIVE REPORT   PREOPERATIVE DIAGNOSIS:  Fracture dislocation of the right ankle.   POSTOPERATIVE DIAGNOSIS:  Fracture dislocation of the right ankle, soft  tissue necrosis.   OPERATION PERFORMED:  1.  Debridement of soft tissue necrosis above the medial malleolus.  2.  Open reduction internal fixation of the right ankle.   SURGEON:  Jene Every, M.D.   ASSISTANT:  Roma Schanz, P.A.   ANESTHESIA:  General.   INDICATIONS FOR PROCEDURE:  The patient is an 77 year old diabetic female,  who while at home had fallen and had a severe fracture dislocation of the  ankle.  She presented to the emergency room with extreme tenting over the  medial aspect of the medial malleolus, tension traction noted upon the skin  and associated soft tissue swelling.  The patient underwent provisional  reduction of the fracture, was placed in a well padded mobilization splint,  admitted for elevation and pain control.  Due to the nature of the fracture,  injury to the soft tissue and the amount of soft swelling, delayed  reconstruction was recommended to minimize trauma to the soft tissues.  The  patient initially had an area of abrasion above the medial malleolus.  It  was felt perhaps secondary to either direct impaction, decubitus from the  position where she was lying or a tension avulsion of the medial skin.  When  seen in the office the sciatic region was noted to have shown some  developing eschar suggesting soft tissue necrosis.  She was repositioned in  her splint which was well padded.  There was no sign of pressure coming from  the splint.  She was also  placed on prophylactic antibiotic.  The wound was  cleaned and Xeroform was placed upon it.  She was indicated for  reconstruction of the ankle and after stabilized fracture to promote bony  unit and soft tissue healing.  The risks and benefits were discussed  including bleeding, infection, damage to neurovascular structures, nonunion,  malunion, need for revision, infection, possible amputation if the fracture  and the soft tissues would not heal.   DESCRIPTION OF PROCEDURE:  With the patient in supine position after  adequate general anesthesia and 1 g Kefzol, the previous splint was removed,  I examined the area of the wound.  There was approximately a 2 cm area above  the medial malleolus.  There was some necrotic tissue noted with the eschar.  There was reactive erythema and soft tissue ecchymosis surrounding the  tissues.  Palpation of the fibula and examination of x-rays which revealed  significant comminution of the distal fibula, transverse fracture.  It was  felt that open plating would only further compromise the skin.  I felt that  a stabilization with  a Rush rod was most appropriate.  Initial on the medial  side rather than making a pull open incision and compromising the skin  further, we decided to reduce the fracture and place percutaneous cannulated  screw fixation staying well beneath the area of potential necrosis and  reactive erythema.  Made a small incision of the distal tip of the fibula.  Tourniquet on for 10 minutes for visualization of some bleeding noted there.  I then placed a 32 drill bit through the distal tip of the fibula into the  intramedullary canal and the fibula.  I then placed a Rush rod and then  advanced it retrograde with excellent stabilization of the fibula.  Following this, it was then further tapped in with a bone tamp in the  subcutaneous tissue.  Returned medial where we made two small incisions over  the medial malleolus after a reduction  maneuver had been performed.  A  placed two guide pins up into the medial meniscus and visualized underneath  via lateral x-ray being satisfactory, 45 partially threaded cancellous  screws were then advanced then to secure the fracture, the guide pins were  then removed.  It appeared to hold the fracture satisfactorily.  Her bone  was fairly osteopenic.  Just prior to open reduction internal fixation the  wound above the medial malleolus was debrided.  Skin edges were debrided.  The area just proximal and distal to that penetrated with a 25 gauge needle.  There was good bleeding tissue.  There was good bleeding tissue surrounding  the wound.  Debrided the eschar as well.  It was not full thickness;  however, there was concern about a central portion of the wound.  I then  placed Xeroform upon it and then covered that area of the wound and then  proceeded with the internal fixation.  Following the internal fixation, we  irrigated the wounds and closed them with 4-0 nylon simple suture, placed  double folded Xeroform over the wound.  Then we placed her in a sterile  compressive dressing with Webril Ace bandages.  The tourniquet was only used  for 10 minutes for visualization over the fibula and then was released for  the remainder of the case.  I then placed her in a removal Cam walker for  further stabilization.   The patient was then extubated without difficulty and transported to  recovery in satisfactory condition.  The patient tolerated the procedure  with no complication.     Trey Paula   JB/MEDQ  D:  09/14/2004  T:  09/14/2004  Job:  [id number]

## 2011-04-01 NOTE — Consult Note (Signed)
NAME:  Julia Oliver, Julia Oliver NO.:  [id number]   MEDICAL RECORD NO.:  [id number]                   PATIENT TYPE:  INP   LOCATION:  5024                                 FACILITY:  [facility]   PHYSICIAN:  Jimmye Norman III, M.D.               DATE OF BIRTH:  03-11-1920   DATE OF CONSULTATION:  03/23/2003  DATE OF DISCHARGE:                                   CONSULTATION   REFERRING PHYSICIANS:  Dr. Reather Littler and Dr. Bernette Redbird.   Dear Dr. Matthias Hughs and Dr. Lucianne Muss,   Thank you very much for asking me to see Ms. [name], a very pleasant 77-  year-old woman with abnormal liver function tests, gallstones and a  possibility of common bile duct stones.   HISTORY OF PRESENT ILLNESS:  The patient was admitted on the 8th with  abnormal liver function tests and abdominal pain radiating to the back.  She  said this started about 24 hours prior to admission with nausea, no  jaundice, pain in the epigastrium underneath her breast and she said it was  moving a little bit to the right and the left and all the way piercing to  the back.  She came into the hospital where she was found to have abnormal  liver function studies.  We were asked to see in surgery today after those  liver function studies continued to be abnormal.  She went for an endoscopic  retrograde cholangiopancreatogram.  Unfortunately, the ampulla could not be  obtained adequately in order to perform a sphincterotomy or a good ERCP  study.  For this reason, the patient may require an open cholecystectomy.   PAST MEDICAL HISTORY:  Past medical history is significant for insulin-  dependent diabetes mellitus.   PAST SURGICAL HISTORY:  She has had cataract surgery and an appendectomy.   MEDICATIONS:  Her medications include Zocor, aspirin and insulin; she takes  Novolog 75/25 -- 32 units q.a.m. and 16 units q.p.m.   ALLERGIES:  She is allergic to PENICILLIN, questionable allergy to NOVOCAINE  -- she just  passed out with it before -- and SULFA MEDICATION she is  allergic to.   REVIEW OF SYSTEMS:  No jaundice, no abnormal stools __________ pain.   PHYSICAL EXAMINATION:  VITAL SIGNS:  On exam, she is afebrile.  Other vital  signs are stable.  GENERAL:  She is very pleasant, looks healthy, in good-quality health.  HEENT:  She is normocephalic and atraumatic.  SKIN:  Although she has a yellow tint to her skin, this is in fluorescent  light.  NECK:  Her neck is supple with no bruits, no palpable masses, no thyroid  masses.  CHEST:  Her chest is clear to auscultation.  CARDIAC:  Regular rhythm and rate with no murmurs, gallops, lifts or heaves.  ABDOMEN:  Her abdomen is soft but no tenderness, no palpable masses.  Cannot  palpate the liver or spleen  on either side.  She has got no rebound or  guarding, no peritonitis.  RECTAL:  Deferred.  PELVIC:  Deferred.   LABORATORY STUDIES:  Her white count is 6.1, hemoglobin 11.8, platelets are  114,000.  Her PT is 12.6, PTT 29.   Ultrasound demonstrated gallstones with possible dilated common duct.   PLAN:  Because an ERCP cannot be successful, a cholangiogram will be  performed at the time of laparoscopic cholecystectomy.  If the cholangiogram  demonstrates large intraductal stones in the common duct, then an open  procedure may be necessary in order to clear these stones.  If the  cholangiogram demonstrates small stones, we may allow her to be seen  postoperatively for a repeat ERCP, if she does not pass these stones  spontaneously. Otherwise, we will plan for an open procedure if necessary to  clear her common duct.                                                Kathrin Ruddy, M.D.    JW/MEDQ  D:  03/23/2003  T:  03/25/2003  Job:  [id number]   cc:   Bernette Redbird, M.D.  [address]  [place], [place] [address]  Fax: [phone]

## 2011-07-16 HISTORY — PX: CLOSED REDUCTION TIBIAL FRACTURE: SHX1361

## 2011-08-04 ENCOUNTER — Encounter: Payer: Self-pay | Admitting: Internal Medicine

## 2011-08-04 ENCOUNTER — Inpatient Hospital Stay (HOSPITAL_COMMUNITY)
Admission: EM | Admit: 2011-08-04 | Discharge: 2011-08-10 | DRG: 493 | Disposition: A | Discharge status: Skilled Nursing Facility | Payer: Medicare Other | Attending: Internal Medicine | Admitting: Internal Medicine

## 2011-08-04 ENCOUNTER — Emergency Department (HOSPITAL_COMMUNITY): Payer: Medicare Other

## 2011-08-04 DIAGNOSIS — H543 Unqualified visual loss, both eyes: Secondary | ICD-10-CM | POA: Diagnosis present

## 2011-08-04 DIAGNOSIS — S82409A Unspecified fracture of shaft of unspecified fibula, initial encounter for closed fracture: Principal | ICD-10-CM | POA: Diagnosis present

## 2011-08-04 DIAGNOSIS — D696 Thrombocytopenia, unspecified: Secondary | ICD-10-CM | POA: Diagnosis present

## 2011-08-04 DIAGNOSIS — D649 Anemia, unspecified: Secondary | ICD-10-CM | POA: Diagnosis not present

## 2011-08-04 DIAGNOSIS — Z7982 Long term (current) use of aspirin: Secondary | ICD-10-CM

## 2011-08-04 DIAGNOSIS — A498 Other bacterial infections of unspecified site: Secondary | ICD-10-CM | POA: Diagnosis not present

## 2011-08-04 DIAGNOSIS — Y92009 Unspecified place in unspecified non-institutional (private) residence as the place of occurrence of the external cause: Secondary | ICD-10-CM

## 2011-08-04 DIAGNOSIS — I951 Orthostatic hypotension: Secondary | ICD-10-CM | POA: Diagnosis present

## 2011-08-04 DIAGNOSIS — Z794 Long term (current) use of insulin: Secondary | ICD-10-CM

## 2011-08-04 DIAGNOSIS — E119 Type 2 diabetes mellitus without complications: Secondary | ICD-10-CM | POA: Diagnosis present

## 2011-08-04 DIAGNOSIS — N390 Urinary tract infection, site not specified: Secondary | ICD-10-CM | POA: Diagnosis not present

## 2011-08-04 DIAGNOSIS — K559 Vascular disorder of intestine, unspecified: Secondary | ICD-10-CM | POA: Diagnosis present

## 2011-08-04 DIAGNOSIS — E876 Hypokalemia: Secondary | ICD-10-CM | POA: Diagnosis present

## 2011-08-04 DIAGNOSIS — Z23 Encounter for immunization: Secondary | ICD-10-CM

## 2011-08-04 DIAGNOSIS — Z79899 Other long term (current) drug therapy: Secondary | ICD-10-CM

## 2011-08-04 DIAGNOSIS — S82209A Unspecified fracture of shaft of unspecified tibia, initial encounter for closed fracture: Principal | ICD-10-CM | POA: Diagnosis present

## 2011-08-04 DIAGNOSIS — Z800 Family history of malignant neoplasm of digestive organs: Secondary | ICD-10-CM

## 2011-08-04 DIAGNOSIS — I10 Essential (primary) hypertension: Secondary | ICD-10-CM | POA: Diagnosis present

## 2011-08-04 DIAGNOSIS — W010XXA Fall on same level from slipping, tripping and stumbling without subsequent striking against object, initial encounter: Secondary | ICD-10-CM | POA: Diagnosis present

## 2011-08-04 DIAGNOSIS — M810 Age-related osteoporosis without current pathological fracture: Secondary | ICD-10-CM | POA: Diagnosis present

## 2011-08-04 LAB — GLUCOSE, CAPILLARY: Glucose-Capillary: 124 mg/dL — ABNORMAL HIGH (ref 70–99)

## 2011-08-04 LAB — APTT: aPTT: 30 seconds (ref 24–37)

## 2011-08-04 LAB — ABO/RH: ABO/RH(D): A POS

## 2011-08-04 LAB — CK TOTAL AND CKMB (NOT AT ARMC)
CK, MB: 6.3 ng/mL (ref 0.3–4.0)
Relative Index: 2.1 (ref 0.0–2.5)

## 2011-08-04 LAB — BASIC METABOLIC PANEL
CO2: 20 mEq/L (ref 19–32)
Chloride: 104 mEq/L (ref 96–112)
GFR calc Af Amer: 48 mL/min — ABNORMAL LOW (ref >60)
Potassium: 3.4 mEq/L — ABNORMAL LOW (ref 3.5–5.1)

## 2011-08-04 LAB — CBC
Hemoglobin: 14.1 g/dL (ref 12.0–15.0)
MCH: 29.8 pg (ref 26.0–34.0)
MCHC: 34.5 g/dL (ref 30.0–36.0)
MCHC: 33.7 g/dL (ref 30.0–36.0)
Platelets: 120 10*3/uL — ABNORMAL LOW (ref 150–400)
RBC: 4.54 MIL/uL (ref 3.87–5.11)
RBC: 4.2 MIL/uL (ref 3.87–5.11)

## 2011-08-04 LAB — DIFFERENTIAL
Basophils Absolute: 0 10*3/uL (ref 0.0–0.1)
Basophils Relative: 0 % (ref 0–1)
Neutro Abs: 16.7 10*3/uL — ABNORMAL HIGH (ref 1.7–7.7)
Neutrophils Relative %: 83 % — ABNORMAL HIGH (ref 43–77)

## 2011-08-04 LAB — LIPID PANEL: Cholesterol: 94 mg/dL (ref 0–200)

## 2011-08-04 LAB — URINALYSIS, ROUTINE W REFLEX MICROSCOPIC
Nitrite: NEGATIVE
Protein, ur: NEGATIVE mg/dL
Specific Gravity, Urine: 1.009 (ref 1.005–1.030)
Urobilinogen, UA: 0.2 mg/dL (ref 0.0–1.0)

## 2011-08-04 LAB — OCCULT BLOOD, POC DEVICE: Fecal Occult Bld: POSITIVE

## 2011-08-04 LAB — HEPATIC FUNCTION PANEL
AST: 21 U/L (ref 0–37)
Bilirubin, Direct: 0.3 mg/dL (ref 0.0–0.3)
Indirect Bilirubin: 1.2 mg/dL — ABNORMAL HIGH (ref 0.3–0.9)
Total Protein: 5.9 g/dL — ABNORMAL LOW (ref 6.0–8.3)

## 2011-08-04 LAB — TROPONIN I: Troponin I: <0.3 ng/mL (ref <0.30)

## 2011-08-04 LAB — PROTIME-INR: INR: 1.02 (ref 0.00–1.49)

## 2011-08-04 LAB — LACTIC ACID, PLASMA: Lactic Acid, Venous: 3.3 mmol/L — ABNORMAL HIGH (ref 0.5–2.2)

## 2011-08-04 LAB — URINE MICROSCOPIC-ADD ON

## 2011-08-04 NOTE — H&P (Unsigned)
Hospital Admission Note Date: 08/04/2011  Patient name: Julia Oliver Medical record number: [id number] Date of birth: Jul 30, 1920 Age: 77 y.o. Gender: female   PCP:   Medical Service:  Attending physician: Dr. Doneen Poisson                Pager: Resident [phone]): Dr. Loistine Chance                        Pager:[phone] Resident (R1): Dr. Clyde Lundborg    Pager:[phone]    Chief Complaint: fall and GI bleeding.  History of Present Illness:  Patient is 77 yo lady with PMH of HTN and DM-II, who presented to the ED with history of fall and syncope. History was provided by patients and her two sons. In the morning, when she was in bath room, she felt that she was going to fall and called her husband for help. Her husband tried to help her at that moment, unfortunately, both of them fell in the bath room. According to her family members, patient blacked out in the bath room for a few seconds when she fell. She injured her right leg and had pain of 2/10 in severity. She denies head injury, loss of control of bladder or bowel movement, did not bite her tongue.  She did not have dizziness, headache, palpitation, chest pain, and feeling of room spinning before her fall. At ED, patient had bright red blood per rectum. Her Bp was 85/45 at ED. After 2 L of IV NS, her Bp improved to 148/65 mmHg. She continued to have two more episodes of bloody stool at ED later.  Of note, per family members, patient blood glucose was 131 in the morning and she had her breakfast before her fall. She was found to have an elevated Bp on last Friday by her primary care doctor, a new HTN medication was started on Monday, name unknown.  Allergies:  Penicillin  Home Medications:  ASPIRIN ENTERIC COATED 81 MG, po, , daily FUROSEMIDE 20 MG, po,  daily HUMALOG MIX 75/25 (INSULIN LISPRO/INSULIN LISPRO PROTAMINE), sq, 27 - 40 units, bid PRILOSEC (OMEPRAZOLE) 20 MG, po, daily ZOCOR (SIMVASTATIN) 40 MG, po,  qdaily  PMH:   HTN DM-II Retinal detachment: Vision loss bilaterally, complete loss on the L Chocystitis  Past Surgical Hx:  Family HX: 5 children, 4 of them have DM-II, one son died of heat attack at age of 31 Social Hx: patient married, living with her husband who is 20 yo old. Non smoker, not drinking alcohol, not using drug.  Insurance: medicare  Review of Systems:  General: no fevers, chills, changes in weight, changes in appetite Skin: no rash HEENT: complete vision loss on the left eye, and minimal vision in the right eyes. no hearing changes or sore throat Pulm: no dyspnea, coughing, wheezing CV: no chest pain, palpitations, shortness of breath Abd: no abdominal pain, nausea/vomiting, diarrhea/constipation GU: no dysuria, hematuria, polyuria Ext: no arthralgias, myalgias, pain in right lower leg. Neuro: no weakness, numbness, or tingling. Non psychotic  Physical Exam:  Vitals: T:97.6   HR:93   BP:85/45   RR:22   O2 saturation: 100% RA General: not in acute disdress HEENT: complete vision loss on the left, very weak vision on the right. slugish pupil response to light on the right. No icterus. Neck isupple, no JVD and carotid artery bruit.  Cardiac: RRR, no rubs, murmurs or gallops Pulm: clear to auscultation bilaterally, moving normal volumes of air Abd: soft, nontender,  nondistended, BS present Ext: warm and well perfused, no pedal edema, right lower leg is wrapped and tender. Neuro: alert and oriented X3, cranial nerves II-XII grossly intact, strength and sensation to light touch equal in bilateral upper and lower extremities   Lab results: Admission on 08/04/2011  Component Date Value Range Status  . Glucose-Capillary (mg/dL) 16/08/9603 540* 98-11 Final  . Neutrophils Relative (%) 08/04/2011 83* 43-77 Final  . Neutro Abs (K/uL) 08/04/2011 16.7* 1.7-7.7 Final  . Lymphocytes Relative (%) 08/04/2011 9* 12-46 Final  . Lymphs Abs (K/uL) 08/04/2011 1.7  0.7-4.0 Final  . Monocytes  Relative (%) 08/04/2011 8  3-12 Final  . Monocytes Absolute (K/uL) 08/04/2011 1.6* 0.1-1.0 Final  . Eosinophils Relative (%) 08/04/2011 0  0-5 Final  . Eosinophils Absolute (K/uL) 08/04/2011 0.0  0.0-0.7 Final  . Basophils Relative (%) 08/04/2011 0  0-1 Final  . Basophils Absolute (K/uL) 08/04/2011 0.0  0.0-0.1 Final  . WBC (K/uL) 08/04/2011 20.0* 4.0-10.5 Final  . RBC (MIL/uL) 08/04/2011 4.54  3.87-5.11 Final  . Hemoglobin (g/dL) 91/47/8295 62.1  30.8-65.7 Final  . HCT (%) 08/04/2011 40.9  36.0-46.0 Final  . MCV (fL) 08/04/2011 90.1  78.0-100.0 Final  . MCH (pg) 08/04/2011 31.1  26.0-34.0 Final  . MCHC (g/dL) 84/69/6295 28.4  13.2-44.0 Final  . RDW (%) 08/04/2011 13.3  11.5-15.5 Final  . Platelets (K/uL) 08/04/2011 137* 150-400 Final  . Prothrombin Time (seconds) 08/04/2011 13.6  11.6-15.2 Final  . INR  08/04/2011 1.02  0.00-1.49 Final  . aPTT (seconds) 08/04/2011 30  24-37 Final  . Sodium (mEq/L) 08/04/2011 137  135-145 Final  . Potassium (mEq/L) 08/04/2011 3.4* 3.5-5.1 Final  . Chloride (mEq/L) 08/04/2011 104  96-112 Final  . CO2 (mEq/L) 08/04/2011 20  19-32 Final  . Glucose, Bld (mg/dL) 09/10/2535 644* 03-47 Final  . BUN (mg/dL) 42/59/5638 31* 7-56 Final  . Creatinine, Ser (mg/dL) 43/32/9518 8.41* 6.60-6.30 Final  . Calcium (mg/dL) 16/11/930 9.1  3.5-57.3 Final  . GFR calc non Af Amer (mL/min) 08/04/2011 40* >60 Final  . GFR calc Af Amer (mL/min) 08/04/2011 48* >60 Final   Comment:                     . Lactic Acid, Venous (mmol/L) 08/04/2011 3.3* 0.5-2.2 Final  . ABO/RH(D)  08/04/2011 A POS   Final  . Antibody Screen  08/04/2011 NEG   Final  . Sample Expiration  08/04/2011 08/07/2011   Final  . Unit Number  08/04/2011 [id number]   Final  . Blood Component Type  08/04/2011 RED CELLS,LR   Final  . Unit division  08/04/2011 00   Final  . Status of Unit  08/04/2011 ALLOCATED   Final  . Transfusion Status  08/04/2011 OK TO TRANSFUSE   Final  . Crossmatch Result  08/04/2011  Compatible   Final  . Unit Number  08/04/2011 [id number]   Final  . Blood Component Type  08/04/2011 RED CELLS,LR   Final  . Unit division  08/04/2011 00   Final  . Status of Unit  08/04/2011 ISSUED   Final  . Transfusion Status  08/04/2011 OK TO TRANSFUSE   Final  . Crossmatch Result  08/04/2011 Compatible   Final  . Fecal Occult Bld  08/04/2011 POSITIVE   Final  . ABO/RH(D)  08/04/2011 A POS   Final  . Color, Urine  08/04/2011 YELLOW  YELLOW Final  . Appearance  08/04/2011 CLOUDY* CLEAR Final  . Specific Gravity, Urine  08/04/2011 1.009  1.005-1.030 Final  . pH  08/04/2011 5.0  5.0-8.0 Final  . Glucose, UA (mg/dL) 40/98/1191 NEGATIVE  NEGATIVE Final  . Hgb urine dipstick  08/04/2011 SMALL* NEGATIVE Final  . Bilirubin Urine  08/04/2011 NEGATIVE  NEGATIVE Final  . Ketones, ur (mg/dL) 47/82/9562 NEGATIVE  NEGATIVE Final  . Protein, ur (mg/dL) 13/06/6577 NEGATIVE  NEGATIVE Final  . Urobilinogen, UA (mg/dL) 46/96/2952 0.2  8.4-1.3 Final  . Nitrite  08/04/2011 NEGATIVE  NEGATIVE Final  . Leukocytes, UA  08/04/2011 LARGE* NEGATIVE Final  . Squamous Epithelial / LPF  08/04/2011 RARE  RARE Final  . WBC, UA (WBC/hpf) 08/04/2011 TOO NUMEROUS TO COUNT  <3 Final   WBC CLUSTERS NOTED  . RBC / HPF (RBC/hpf) 08/04/2011 0-2  <3 Final  . Bacteria, UA  08/04/2011 FEW* RARE Final  . Casts  08/04/2011 HYALINE CASTS* NEGATIVE Final    Imaging results:   CT-head with and without contrast:  Fairly extensive chronic microvascular ischemic change is   noted. But no acute finding.  Portable Chest x-ray: Stable cardiomegaly without pulmonary edema. Mild bibasilar atelectasis.  No acute cardiopulmonary disease.  X-ray-leg: right side ractures of the shafts of the tibia and fibula.  X-ray -Ankle:  degenerative changes at the  right mortise joint with no definite additional fracture.   Assment & Plan by Problem: 77 yo lady with PMH of HTN with recent medication adjustment, DM-II and vision loss, who was  admitted because of syncope and fall, fracture of right tibial and fibular bones and bright red blood per rectum.  Problem 1- syncope: most likely due to orthostatic hypotension. Patient's blood pressure medication was adjusted recently. It was possible that she stood up quickly and had a transit hypotension episode. Other DD includes vasovagal syncope, hypoglycemia (less likely, patient's glucose was 131 and patient actually had breakfast before her fall); cardiac arrhythmia ( less likely, her EKG shows regular sinus rhythm and rate at ED); TIA ( less likely, she did not fell any weakness or numbness before fall). Patient was admitted to step down unit.        -Will get CT-head, EKG in morning, CE X 3         -Will consider 2D-Echo.  Problem 2-GI bleeding: etiology is unclear. DD includes hemoroid, diverticulosis, angiodysplasia, colon polyps, colon cancer, AV malformation, or upper GI bleeding. Patient Bp was low (85/45) at admission, after 2 L of IV fluid, her Bp improved to 148/65 mmHg.          -will place two large bore IV line and blood type and cross.           -GI was consulted in ED          -will check CBC q6h          -will check liver function  Probem 3-fracture of tibia and fibular: Orthopedic Surgery was consulted, patient was taken to surgery.  Problem 4-DM-II: place on SSI, will check A1C and fasting lipid  DVT: SCD

## 2011-08-05 DIAGNOSIS — W19XXXA Unspecified fall, initial encounter: Secondary | ICD-10-CM

## 2011-08-05 DIAGNOSIS — S82209A Unspecified fracture of shaft of unspecified tibia, initial encounter for closed fracture: Secondary | ICD-10-CM

## 2011-08-05 DIAGNOSIS — R55 Syncope and collapse: Secondary | ICD-10-CM

## 2011-08-05 DIAGNOSIS — K922 Gastrointestinal hemorrhage, unspecified: Secondary | ICD-10-CM

## 2011-08-05 LAB — CBC
HCT: 32.7 % — ABNORMAL LOW (ref 36.0–46.0)
HCT: 31.5 % — ABNORMAL LOW (ref 36.0–46.0)
HCT: 31.9 % — ABNORMAL LOW (ref 36.0–46.0)
HCT: 30.1 % — ABNORMAL LOW (ref 36.0–46.0)
Hemoglobin: 11 g/dL — ABNORMAL LOW (ref 12.0–15.0)
Hemoglobin: 10.4 g/dL — ABNORMAL LOW (ref 12.0–15.0)
Hemoglobin: 10 g/dL — ABNORMAL LOW (ref 12.0–15.0)
MCH: 29.6 pg (ref 26.0–34.0)
MCH: 29.3 pg (ref 26.0–34.0)
MCHC: 33 g/dL (ref 30.0–36.0)
MCHC: 33.2 g/dL (ref 30.0–36.0)
MCV: 87.9 fL (ref 78.0–100.0)
MCV: 87.9 fL (ref 78.0–100.0)
Platelets: 110 10*3/uL — ABNORMAL LOW (ref 150–400)
RBC: 3.72 MIL/uL — ABNORMAL LOW (ref 3.87–5.11)
RBC: 3.63 MIL/uL — ABNORMAL LOW (ref 3.87–5.11)
RDW: 14 % (ref 11.5–15.5)
WBC: 8.1 10*3/uL (ref 4.0–10.5)

## 2011-08-05 LAB — BASIC METABOLIC PANEL
CO2: 25 mEq/L (ref 19–32)
Calcium: 7.8 mg/dL — ABNORMAL LOW (ref 8.4–10.5)
Chloride: 108 mEq/L (ref 96–112)
Potassium: 4 mEq/L (ref 3.5–5.1)
Sodium: 139 mEq/L (ref 135–145)

## 2011-08-05 LAB — TROPONIN I: Troponin I: <0.3 ng/mL (ref <0.30)

## 2011-08-05 LAB — CK TOTAL AND CKMB (NOT AT ARMC)
CK, MB: 3.7 ng/mL (ref 0.3–4.0)
Relative Index: 1.7 (ref 0.0–2.5)

## 2011-08-05 LAB — PREPARE RBC (CROSSMATCH)

## 2011-08-06 ENCOUNTER — Other Ambulatory Visit: Payer: Self-pay | Admitting: Internal Medicine

## 2011-08-06 DIAGNOSIS — K55059 Acute (reversible) ischemia of intestine, part and extent unspecified: Secondary | ICD-10-CM

## 2011-08-06 DIAGNOSIS — K921 Melena: Secondary | ICD-10-CM

## 2011-08-06 LAB — BASIC METABOLIC PANEL
BUN: 10 mg/dL (ref 6–23)
CO2: 28 mEq/L (ref 19–32)
Calcium: 7.8 mg/dL — ABNORMAL LOW (ref 8.4–10.5)
Chloride: 102 mEq/L (ref 96–112)
Creatinine, Ser: 0.85 mg/dL (ref 0.50–1.10)
Creatinine, Ser: 0.74 mg/dL (ref 0.50–1.10)
GFR calc Af Amer: >60 mL/min (ref >60)
GFR calc Af Amer: >60 mL/min (ref >60)
GFR calc non Af Amer: >60 mL/min (ref >60)
GFR calc non Af Amer: >60 mL/min (ref >60)
Sodium: 137 mEq/L (ref 135–145)

## 2011-08-06 LAB — CBC
HCT: 28.6 % — ABNORMAL LOW (ref 36.0–46.0)
HCT: 29.5 % — ABNORMAL LOW (ref 36.0–46.0)
HCT: 27.2 % — ABNORMAL LOW (ref 36.0–46.0)
MCH: 30.7 pg (ref 26.0–34.0)
MCH: 29.5 pg (ref 26.0–34.0)
MCHC: 34.8 g/dL (ref 30.0–36.0)
MCHC: 33.9 g/dL (ref 30.0–36.0)
MCHC: 33.2 g/dL (ref 30.0–36.0)
MCHC: 33.5 g/dL (ref 30.0–36.0)
MCV: 88.3 fL (ref 78.0–100.0)
MCV: 89.4 fL (ref 78.0–100.0)
Platelets: 85 10*3/uL — ABNORMAL LOW (ref 150–400)
Platelets: 86 10*3/uL — ABNORMAL LOW (ref 150–400)
RBC: 3.09 MIL/uL — ABNORMAL LOW (ref 3.87–5.11)
RBC: 3.3 MIL/uL — ABNORMAL LOW (ref 3.87–5.11)
RDW: 14.1 % (ref 11.5–15.5)
RDW: 13.7 % (ref 11.5–15.5)
RDW: 13.7 % (ref 11.5–15.5)
RDW: 13.4 % (ref 11.5–15.5)
WBC: 8.2 10*3/uL (ref 4.0–10.5)
WBC: 9.4 10*3/uL (ref 4.0–10.5)

## 2011-08-06 LAB — URINE CULTURE
Colony Count: >=100000
Culture  Setup Time: 201209201535

## 2011-08-06 LAB — MAGNESIUM: Magnesium: 1.8 mg/dL (ref 1.5–2.5)

## 2011-08-07 DIAGNOSIS — K55059 Acute (reversible) ischemia of intestine, part and extent unspecified: Secondary | ICD-10-CM

## 2011-08-07 LAB — CBC
MCH: 29.3 pg (ref 26.0–34.0)
MCV: 88.3 fL (ref 78.0–100.0)
Platelets: 86 10*3/uL — ABNORMAL LOW (ref 150–400)
Platelets: 90 10*3/uL — ABNORMAL LOW (ref 150–400)
RBC: 3.11 MIL/uL — ABNORMAL LOW (ref 3.87–5.11)
RBC: 3.42 MIL/uL — ABNORMAL LOW (ref 3.87–5.11)
RDW: 13.3 % (ref 11.5–15.5)
RDW: 13.1 % (ref 11.5–15.5)
WBC: 7.4 10*3/uL (ref 4.0–10.5)

## 2011-08-07 LAB — TYPE AND SCREEN
ABO/RH(D): A POS
Antibody Screen: NEGATIVE
Unit division: 0
Unit division: 0

## 2011-08-07 LAB — BASIC METABOLIC PANEL
CO2: 28 mEq/L (ref 19–32)
Calcium: 7.8 mg/dL — ABNORMAL LOW (ref 8.4–10.5)
GFR calc Af Amer: >60 mL/min (ref >60)
GFR calc non Af Amer: >60 mL/min (ref >60)
Sodium: 134 mEq/L — ABNORMAL LOW (ref 135–145)

## 2011-08-07 LAB — URINALYSIS, ROUTINE W REFLEX MICROSCOPIC
Nitrite: POSITIVE — AB
Specific Gravity, Urine: 1.02 (ref 1.005–1.030)
Urobilinogen, UA: 0.2 mg/dL (ref 0.0–1.0)
pH: 5.5 (ref 5.0–8.0)

## 2011-08-07 LAB — URINE MICROSCOPIC-ADD ON

## 2011-08-08 LAB — GLUCOSE, CAPILLARY
Glucose-Capillary: 207 mg/dL — ABNORMAL HIGH (ref 70–99)
Glucose-Capillary: 166 mg/dL — ABNORMAL HIGH (ref 70–99)
Glucose-Capillary: 94 mg/dL (ref 70–99)
Glucose-Capillary: 199 mg/dL — ABNORMAL HIGH (ref 70–99)
Glucose-Capillary: 340 mg/dL — ABNORMAL HIGH (ref 70–99)
Glucose-Capillary: 218 mg/dL — ABNORMAL HIGH (ref 70–99)

## 2011-08-08 LAB — BASIC METABOLIC PANEL
CO2: 30 mEq/L (ref 19–32)
Calcium: 8.4 mg/dL (ref 8.4–10.5)
Chloride: 99 mEq/L (ref 96–112)
Creatinine, Ser: 0.94 mg/dL (ref 0.50–1.10)
Glucose, Bld: 335 mg/dL — ABNORMAL HIGH (ref 70–99)
Sodium: 134 mEq/L — ABNORMAL LOW (ref 135–145)

## 2011-08-08 LAB — CBC
Hemoglobin: 9.1 g/dL — ABNORMAL LOW (ref 12.0–15.0)
MCH: 29.4 pg (ref 26.0–34.0)
MCV: 88.1 fL (ref 78.0–100.0)
RBC: 3.1 MIL/uL — ABNORMAL LOW (ref 3.87–5.11)
WBC: 6.2 10*3/uL (ref 4.0–10.5)

## 2011-08-09 DIAGNOSIS — K922 Gastrointestinal hemorrhage, unspecified: Secondary | ICD-10-CM

## 2011-08-09 DIAGNOSIS — W19XXXA Unspecified fall, initial encounter: Secondary | ICD-10-CM

## 2011-08-09 DIAGNOSIS — S82209A Unspecified fracture of shaft of unspecified tibia, initial encounter for closed fracture: Secondary | ICD-10-CM

## 2011-08-09 DIAGNOSIS — R55 Syncope and collapse: Secondary | ICD-10-CM

## 2011-08-09 LAB — BASIC METABOLIC PANEL
BUN: 22 mg/dL (ref 6–23)
CO2: 31 mEq/L (ref 19–32)
Calcium: 8.7 mg/dL (ref 8.4–10.5)
Creatinine, Ser: 0.98 mg/dL (ref 0.50–1.10)
GFR calc Af Amer: >60 mL/min (ref >60)

## 2011-08-09 LAB — CBC
HCT: 27.8 % — ABNORMAL LOW (ref 36.0–46.0)
MCH: 29.7 pg (ref 26.0–34.0)
MCV: 88 fL (ref 78.0–100.0)
Platelets: 115 10*3/uL — ABNORMAL LOW (ref 150–400)
RDW: 13 % (ref 11.5–15.5)

## 2011-08-09 LAB — URINE CULTURE
Colony Count: >=100000
Culture  Setup Time: 201209232103

## 2011-08-09 LAB — GLUCOSE, CAPILLARY: Glucose-Capillary: 160 mg/dL — ABNORMAL HIGH (ref 70–99)

## 2011-08-09 NOTE — Progress Notes (Signed)
Quick Note:  Hospital patient - no recall or letter ______

## 2011-08-10 LAB — GLUCOSE, CAPILLARY: Glucose-Capillary: 234 mg/dL — ABNORMAL HIGH (ref 70–99)

## 2011-08-11 LAB — BASIC METABOLIC PANEL
BUN: 24 — ABNORMAL HIGH
Chloride: 98
GFR calc non Af Amer: 51 — ABNORMAL LOW
Potassium: 4.1
Sodium: 136

## 2011-08-11 LAB — CBC
HCT: 38.3
Hemoglobin: 13.4
MCV: 91
Platelets: 148 — ABNORMAL LOW
RBC: 4.21
WBC: 7

## 2011-08-11 LAB — APTT: aPTT: 33

## 2011-08-11 LAB — GLUCOSE, CAPILLARY: Glucose-Capillary: 215 mg/dL — ABNORMAL HIGH (ref 70–99)

## 2011-08-19 NOTE — Consult Note (Signed)
NAMEMarland Kitchen  TA, FAIR NO.:  [id number]  MEDICAL RECORD NO.:  [id number]  LOCATION:  2608                         FACILITY:  [facility]  PHYSICIAN:  Jordan Hawks. Elnoria Howard, MD    DATE OF BIRTH:  [birth year]  DATE OF CONSULTATION:  08/05/2011 DATE OF DISCHARGE:                                CONSULTATION   REASON FOR CONSULTATION:  Hematochezia and syncope.  This is unassigned teaching service patient.  HISTORY OF PRESENT ILLNESS:  This is a 77 year old female with past medical history of hypertension, diabetes, hyperlipidemia, and status post cholecystitis who was admitted to the hospital with a syncopal episode.  Apparently, the patient was at home yesterday, and she started having bowel movements.  She initially had complaints of abdominal pain in the left lower quadrant.  Subsequently, after having several bowel movements, the pain resolved but then she progressed to having hematochezia.  As a result of her symptoms, she apparently had a syncopal episode and fell down.  She was then brought in by her family members to the hospital for further evaluation and treatment.  Upon arrival, her blood pressure was noted to be 85/45 and then subsequently with IV hydration, her blood pressure improved to 148/65.  No complaints of any chest pain or shortness of breath.  The patient upon arrival in the emergency room also had a couple of other bloody bowel movements. As a result of the symptoms, a GI consultation was requested.  She denies having a prior history of a colonoscopy and her sister died of colon cancer at the age of 32.  Interestingly, she reports having these type of diarrheal bleeding episodes approximately two to three times per year with a similar pattern of presentation, but she has never required any hospitalizations for those prior events.  PAST MEDICAL AND SURGICAL HISTORY:  As stated above.  FAMILY HISTORY:  Noncontributory.  SOCIAL HISTORY:  Negative for  alcohol, tobacco, or illicit drug use.  MEDICATIONS: 1. Sliding scale insulin. 2. Lasix 20 mg p.o. daily 3. Aspirin 81 mg p.o. daily 4. Lisinopril 20 mg p.o. daily. 5. Vancomycin 500 mg IV q.12 h. 6. Morphine 1 to 4 mg IV q.4 h. p.r.n. 7. Zofran 4 mg IV q.4 h. p.r.n.  ALLERGIES:  PENICILLIN, SULFA, AND PROCAINE.  PHYSICAL EXAMINATION:  VITAL SIGNS:  Blood pressure is 106/35, heart rate 91, pulse ox is 98% on 2 liters of oxygen. GENERAL:  The patient is in no acute distress, alert and oriented. HEENT:  Normocephalic, atraumatic.  Extraocular muscles intact. NECK:  Supple.  No lymphadenopathy. LUNGS:  Clear to auscultation bilaterally. CARDIOVASCULAR:  Regular rate and rhythm. ABDOMEN:  Moderately obese, soft, nontender, nondistended. EXTREMITIES:  No clubbing, cyanosis, or edema.  LABORATORY VALUES:  White blood cell count 8.1, hemoglobin 10.7, MCV is 87.9, platelets at 110.  Sodium is 139, potassium 4.0, chloride 108, CO2 of 25, glucose 192, BUN 22, creatinine 1.0.  Total bilirubin 1.5, alk phos 53, AST 21, ALT 12, and albumin is 3.2.  IMPRESSION: 1. Hematochezia. 2. Syncope. 3. Tib-fib fracture.  The patient's clinical presentation with abdominal pain followed by the hematochezia suspicious for ischemic colitis, although diverticular bleed is also another possibility, typically does  not present with abdominal pain.  However, further evaluation is required in that she has a family history of colon cancer in her sister and has never had a colonoscopy before.  PLAN:  At this time is to perform a colonoscopy and then further recommendations will be made pending the findings.     Jordan Hawks Elnoria Howard, MD     PDH/MEDQ  D:  08/05/2011  T:  08/05/2011  Job:  [id number]  Electronically Signed by Jeani Hawking MD on 08/19/2011 08:15:03 AM

## 2011-08-25 NOTE — Discharge Summary (Signed)
NAME:  [name], [name] NO.:  [id number]  MEDICAL RECORD NO.:  [id number]  LOCATION:  4501                         FACILITY:  [facility]  PHYSICIAN:  Doneen Poisson, MD     DATE OF BIRTH:  [birth year]  DATE OF ADMISSION:  08/04/2011 DATE OF DISCHARGE:  08/10/2011                              DISCHARGE SUMMARY   PRIMARY CARE PHYSICIAN:  Dr. Corinna Capra.  DISCHARGE DIAGNOSES: 1. Syncope. 2. Gastrointestinal bleeding secondary to ischemic colitis. 3. Fracture of the tibia and fibula. 4. Diabetes type 2. 5. Hypertension.  DISCHARGE MEDICATIONS: 1. Calcium citrate 150 mg p.o. three times daily. 2. Vitamin D3 800 units p.o. daily. 3. Ciprofloxacin 250 mg p.o. twice daily for 4 days. 4. Loperamide 2 mg, P.O. Daily. 5. Aspirin 81 mg p.o. daily. 6. Furosemide 20 mg 1 tablet p.o. Daily 7. Humalog mix 27-40 units subcutaneously, twice daily. 8. Lisinopril 20 mg 1 tablet p.o. daily. 9. Omeprazole 20 mg 1 tablet p.o. daily. 10.Zocor 1 tablet 40 mg p.o. daily.  DISPOSITION AND FOLLOWUP:  Ms. Donavan was discharged to a nursing  home for rehab.  Follow up with orthopaedic surgury.  PROCEDURE PERFORMED: 1. X-ray of right tibia and fibula:  There are comminuted fractures of     proximal shaft of the fibula and of the proximal and mid shaft of     the tibia.  There is a slight displacement at both fracture     sites. 2. X-ray of right ankle:  Chronic postoperative and advanced     degenerative changes at right mortise joint with no definite     additional fractures. 3. Chest portable x-ray:  Stable cardiomegaly without pulmonary edema. 4. CT head without contrast:  No acute findings. 5. EKG:  No significant change.  CONSULTATION: 1. Orthopedic Surgery (Dr. Lestine Box) performed IM nailing, right     closed fixation of tibia-fibula fracture on August 04, 2011. 2. GI (Dr. Elnoria Howard) performed a sigmoidoscopy.  HISTORY OF PRESENT ILLNESS:    Ms. Julia Oliver is an 77 year old  woman with a past medical history of  hypertension, diabetes, and vision loss, who presented to the ED with  a history of a fall and syncope.  On the morning of admisson, when she was in the bathroom, she felt she was going to fall and called  her husband for help.  He tried to help her, unfortunately, both of  them fell.  According to her family members, she blacked out in the  bathroom for a few seconds when she fell.  She injured her right leg  and had pain of 2/10 in severity.  She denies head injury, incontinence  of bladder or bowels, and did not bite her tongue.  She did not have  dizziness, headache, palpitations, chest pain, or a feeling of room  spinning.  In the ED, she had bright red blood per rectum and a blood  pressure of 85/45.  After 2 liters of normal saline, her blood pressure  improved to 148/65 mmHg.  She continued to have 2 more episodes of  bloody stool in the ED.  Per family members, her blood glucose was  131 that morning and she had her breakfast before  her fall.  She was  found to have an elevated blood pressure on Friday by her primary  care doctor, and a new antihypertensive medication was started on  Monday, name unknown.  PHYSICAL EXAMINATION:  ADMISSION VITAL SIGNS:  Temperature 97.6, blood pressure 85/45, heart rate 93, respiration rate 22, oxygen saturation 100% on room air. GENERAL:  No acute distress. HEENT:  Complete vision loss on the left, very weak vision on the  right.  Sluggish pupil response to light on the right.  No icterus. NECK:  Supple.  No JVD or bruits. CARDIAC:  S1/S2, regular rhythm and rate, no murmurs or gallops. LUNGS:  Clear to auscultation bilaterally. ABDOMEN:  Soft, nontender, nondistended, bowel sounds present.  No rebound pain. EXTREMITIES:  Warm and well perfused, no edema.  Right lower leg is wrapped and tender.  The left foot has a deformity. NEURO:  Alert, oriented x 3.  Cranial nerves II through XII grossly  intact.   Muscle strength is normal in all groups.  Sensation to  light touch on the left lower extremity is decreased.  The right side was not assessed due to being wrapped.  ADMISSION LABS:   PT 13.6, PTT 30. Sodium 137, potassium 3.4, chloride 104, bicarbonate 20, BUN 31, creatinine 1.27, glucose 119. Liver function normal except for total protein 5.9. Lactic acid 3.3. Fecal occult blood test positive. Urinalysis negative for nitrites, large leukocytes; negative for protein and ketones. Urine microscopic analysis:  Few bacteria.  HOSPITAL COURSE BY PROBLEMS: 1. Syncope and fall:  Most likely secondary to orthostatic hypotension.       There are several factors which may have contributed to her      syncope episode and fall.  First, her hypertension medication was      recently started.  Secondly, she has been having hematochezia      recently, which may have lead to orthostatic hypotension.  Her blood      pressure was 85/45 in the ED and improved to 148/65 after 2 liters      of IV normal saline.  Her head CT had no acute abnormalities.       The EKG had no arrhythmia.  She had negative cardiac enzymes.  Physical examination did not reveal focal signs except for decreased     sensation in her legs and feet to light touch.  Cardiac auscultation      did not demonstrate a murmur or gallop.  We continued IV fluids and     monitored her closely.  At discharge, she did not have dizziness,      palpitations, chest pain, or lightheadedness.  2. Gastrointestinal bleeding:  GI was consulted.  Dr. Elnoria Howard performed a      sigmoidoscopy, which showed ischemic colitis likely secondary to     hypotension. This should gradually recover with the supportive therapy.       At discharge, she did not have any new episodes of bloody stool or      abdominal pain.  3. Fracture of the tibia and fibula:  Orthopedic Surgery was consulted.     Dr. Lestine Box performed IM nailing, right closed fixation of tibia-      fibular fracture on August 04, 2011.  At discharge, she had     mild pain in her right leg.  She will have 2-3 months of rehab in      a nursing home.  4. Hypertension.  Her home medication was restarted and her blood  pressure was stable in the hospital.  DISCHARGE VITAL SIGNS:  Temperature 98.8, blood pressure 130/65, heart rate 84, respiration rate 20, oxygen saturation 98% at room air.  DISCHARGE LABS:  Sodium 135, potassium 4, chloride 99, bicarbonate 31,  glucose 156, BUN 22, creatinine 0.98, calcium 8.7.  WBC count 6.3,  hemoglobin 9.4, platelets 155.   ______________________________ Lorretta Harp, MD   ______________________________ Doneen Poisson, MD   XN/MEDQ  D:  08/09/2011  T:  08/09/2011  Job:  [id number]  cc:   Corinna Capra, DO  Electronically Signed by Lorretta Harp MD on 08/21/2011 06:55:21 PM Electronically Signed by Doneen Poisson  on 08/25/2011 09:34:42 AM

## 2011-08-31 NOTE — Discharge Summary (Signed)
  NAME:  Julia Oliver, Julia Oliver NO.:  [id number]  MEDICAL RECORD NO.:  [id number]  LOCATION:  4501                         FACILITY:  [facility]  PHYSICIAN:  Doneen Poisson, MD     DATE OF BIRTH:  [birth year]  DATE OF ADMISSION:  08/04/2011 DATE OF DISCHARGE:  08/10/2011                              DISCHARGE SUMMARY   ADDENDUM:  [name] is 77 year old woman with a past medical history of hypertension and diabetes,  who was recently admitted because of syncope, ischemic colitis-induced GI bleeding, and fracture of  tibia and fibula.  At discharge, her syncope resolved.  She did not have new episodes of bloody  stool.  She was status post surgery for her fractured tibia and fibula.  We think that the most likely cause of her syncope was orthostatic hypotension related to the use of Lasix  and recently added anti-hypertensive. Her ischemic colitis was felt to be caused by related hypoperfusion.  Although her syncope resolved, continuing these medications put her at risk for developing orthostatic hypotension and syncope again.  She should avoid Lasix and lisinopril.  These 2 medications were on her discharge medication list.  I called her doctor  at [facility], Dr. Sydnee Cabal and also spoke with her nurse.  She has been doing well since discharge.  She is doing physical therapy regularly.  Her blood pressure has been 129/67 and 138/72 for yesterday and today.  I discussed with Dr. Phoebe Sharps about our thoughts of discontinuing the Lasix and lisinopril in order to prevent possible orthostatic hypotension, syncope, and ischemic colitis.  Dr. Phoebe Sharps will look at [name]'s chart and consider possible adjustment.   ______________________________ Lorretta Harp, MD   ______________________________ Doneen Poisson, MD   XN/MEDQ  D:  08/26/2011  T:  08/26/2011  Job:  [id number]  cc:   Corinna Capra, DO Sydnee Cabal, MD  Electronically Signed by Lorretta Harp MD on 08/26/2011  10:31:45 PM Electronically Signed by Doneen Poisson  on 08/31/2011 08:48:00 AM

## 2011-09-01 NOTE — Consult Note (Signed)
  NAME:  Julia Oliver, Julia Oliver NO.:  [id number]  MEDICAL RECORD NO.:  [id number]  LOCATION:  2608                         FACILITY:  [facility]  PHYSICIAN:  Leonides Grills, M.D.     DATE OF BIRTH:  Aug 17, 1920  DATE OF CONSULTATION:  08/04/2011 DATE OF DISCHARGE:                                CONSULTATION   CHIEF COMPLAINT:  Right leg pain since today.  HISTORY:  This is an 77 year old female who fell today and presents the [facility] ED with right leg pain and deformity.  She was brought to the ED, and it was thought that she may have had a syncopal episode, and she was admitted to Medicine for further evaluation and treatment of this.  She had broken her ankle previously and was fixed by Dr. Jillyn Hidden she has known diabetes mellitus and likely peripheral neuropathy.  PAST MEDICAL HISTORY:  Significant for diabetes and hypertension.  She does not smoke or drink.  She has an allergy to PENICILLIN.  MEDICATIONS:  Aspirin, Humalog, Lasix, Metamucil, Prilosec, Zocor.  FAMILY HISTORY:  Both brothers have diabetes mellitus.  REVIEW OF SYSTEMS:  She reports no chest pain, fever, or chills.  PHYSICAL EXAMINATION:  GENERAL:  She is well-nourished, well-developed, in no apparent distress, very pleasant female. HEENT:  Normocephalic, atraumatic.  Extraocular motions are intact bilaterally. CHEST:  Equal bilaterally to expansion, retraction, breathing.  She is relatively sleepy. EXTREMITIES:  She has a splint in place, active and passive range of motion of toes did not elicit any pain.  Compartments soft in the foot. Toes were warm.  There is no impinging areas of splint.  X-rays show a comminuted midshaft tib-fib fracture.  There is previous hardware from a previous bimalleolar ankle fracture.  Her hemoglobin is 14.1, hematocrit 40.9.  PTT is 30, PT is 13.6, INR is 1.02.  Her potassium is 3.4, sodium 137, chloride 104, carbon dioxide 20, glucose 119, BUN 31, and creatinine is  1.27.  IMPRESSION:  Right tibia and fibula fracture.  PLAN:  I explained to [name] as well as her brothers at this point due to the instability of the fracture and the fact that she would be able to weight bear earlier and the predictability of healing with an IM nail and due to the amount of comminution, I would recommend surgery and they wished to proceed with the surgery required is a IM nailing for right tib-fib fracture.  We re-reviewed the risks, which include infection or vessel injury, nonunion, malunion, hardware irritation, hardware failure, persistent pain, worse pain, prolonged recovery, stiffness, arthritis of juxta-articular joints, wound healing problems, DVT, PE, and possibility of death and amputation were all explained.  Questions were encouraged and answered.  We will proceed with this in the near future. I also reviewed this with Internal Medicine, and they have cleared her as well for surgery.     Leonides Grills, M.D.     PB/MEDQ  D:  08/04/2011  T:  08/04/2011  Job:  [id number]  Electronically Signed by Leonides Grills M.D. on 09/01/2011 04:40:18 PM

## 2011-09-01 NOTE — Op Note (Signed)
  NAME:  Julia Oliver, PIGGOTT NO.:  [id number]  MEDICAL RECORD NO.:  [id number]  LOCATION:  2608                         FACILITY:  [facility]  PHYSICIAN:  Leonides Grills, M.D.     DATE OF BIRTH:  03/19/1920  DATE OF PROCEDURE:  08/04/2011 DATE OF DISCHARGE:                              OPERATIVE REPORT   PREOPERATIVE DIAGNOSIS:  Closed right tib-fib fracture.  POSTOPERATIVE DIAGNOSIS:  Closed right tib-fib fracture.  OPERATION:  IM nailing, right closed tib-fib fracture.  ANESTHESIA:  General.  SURGEON:  Leonides Grills, MD  ASSISTANT:  Richardean Canal, PA-C  ESTIMATED BLOOD LOSS:  Minimal.  TOURNIQUET:  None.  COMPLICATIONS:  None.  DISPOSITION:  Stable to PR.  INDICATIONS:  An 77 year old female who slipped and fell today and sustained the above injury.  She was consented for the above procedure. All risks including infection, vessel injury, nonunion, malunion, hardware irritation, hardware failure, persistent pain, worsening pain, prolonged recovery, stiffness, arthritis, wound healing problems, DVT, PE, death, and amputation were all explained.  Questions were encouraged and answered.  OPERATION:  The patient was brought to the operating room, placed in supine position.  After adequate general anesthesia was administered as well as Ancef g IV as well as on vancomycin 1 g IV piggyback.  The bump was placed on right ipsilateral hip internally rotating right lower extremity and the right lower extremity was prepped and draped in sterile manner.  No tourniquet was used.  Anatomical landmarks include the patella tendon, the anterior tibial tubercle were then mapped out. A medial parapatellar incision was then made.  Dissection was carried down through skin.  Hemostasis was obtained.  Retinaculum was incised medial to the patella tendon.  Dissection was carried down to the anterior crest of the proximal tibia.  We then placed a awl under C-arm guidance in AP and  lateral planes.  We then passed a ball-tipped guidewire through the awl and across the fracture site with fracture held in reduced position.  We then placed the skin sleeve proximally and then commenced reaming.  We reamed from 8 mm up to 13 mm and then placed a 13 x 30 mm Biomet ALPS nail with the fracture held in an anatomic position.  With the nail in place, we then placed one locking screw in the medial oblique planes and then free-handed two distal screws from medial to lateral.  Once this was done, we obtained x-rays in AP and lateral planes, proximal tibia, distal tibia, and fracture site showed near anatomic fixation and the nail at a proper position as well. Clinically, the leg was well aligned.  Compartments were soft throughout the case in leg and foot.  The wounds were copiously irrigated with normal saline.  Deep tissues were closed with 2- 0 Vicryl for the retinaculum.  Subcu was closed with 3-0 Vicryl, skin was closed with staples.  Sterile dressing was applied.  Jones dressing was applied.  The patient was stable to PR.    Leonides Grills, M.D.     PB/MEDQ  D:  08/04/2011  T:  08/05/2011  Job:  [id number]  Electronically Signed by Leonides Grills M.D. on 09/01/2011 04:40:22 PM

## 2012-07-04 ENCOUNTER — Inpatient Hospital Stay (HOSPITAL_COMMUNITY)
Admission: EM | Admit: 2012-07-04 | Discharge: 2012-07-10 | DRG: 280 | Disposition: A | Discharge status: Skilled Nursing Facility | Payer: Medicare Other | Source: Other Acute Inpatient Hospital | Attending: Internal Medicine | Admitting: Internal Medicine

## 2012-07-04 ENCOUNTER — Inpatient Hospital Stay (HOSPITAL_COMMUNITY): Payer: Medicare Other

## 2012-07-04 ENCOUNTER — Encounter (HOSPITAL_COMMUNITY): Payer: Self-pay | Admitting: Internal Medicine

## 2012-07-04 DIAGNOSIS — E871 Hypo-osmolality and hyponatremia: Secondary | ICD-10-CM | POA: Diagnosis present

## 2012-07-04 DIAGNOSIS — E119 Type 2 diabetes mellitus without complications: Secondary | ICD-10-CM

## 2012-07-04 DIAGNOSIS — K5900 Constipation, unspecified: Secondary | ICD-10-CM | POA: Diagnosis present

## 2012-07-04 DIAGNOSIS — I249 Acute ischemic heart disease, unspecified: Secondary | ICD-10-CM | POA: Diagnosis present

## 2012-07-04 DIAGNOSIS — I214 Non-ST elevation (NSTEMI) myocardial infarction: Principal | ICD-10-CM

## 2012-07-04 DIAGNOSIS — I5033 Acute on chronic diastolic (congestive) heart failure: Secondary | ICD-10-CM | POA: Diagnosis present

## 2012-07-04 DIAGNOSIS — I509 Heart failure, unspecified: Secondary | ICD-10-CM | POA: Diagnosis present

## 2012-07-04 DIAGNOSIS — F0390 Unspecified dementia without behavioral disturbance: Secondary | ICD-10-CM | POA: Diagnosis present

## 2012-07-04 DIAGNOSIS — H548 Legal blindness, as defined in USA: Secondary | ICD-10-CM | POA: Diagnosis present

## 2012-07-04 DIAGNOSIS — I440 Atrioventricular block, first degree: Secondary | ICD-10-CM | POA: Diagnosis present

## 2012-07-04 DIAGNOSIS — I10 Essential (primary) hypertension: Secondary | ICD-10-CM | POA: Diagnosis present

## 2012-07-04 HISTORY — DX: Unqualified visual loss, both eyes: H54.3

## 2012-07-04 HISTORY — DX: Essential (primary) hypertension: I10

## 2012-07-04 LAB — COMPREHENSIVE METABOLIC PANEL
AST: 29 U/L (ref 0–37)
BUN: 19 mg/dL (ref 6–23)
CO2: 25 mEq/L (ref 19–32)
Chloride: 94 mEq/L — ABNORMAL LOW (ref 96–112)
Creatinine, Ser: 0.82 mg/dL (ref 0.50–1.10)
GFR calc non Af Amer: 61 mL/min — ABNORMAL LOW (ref >90)
Glucose, Bld: 186 mg/dL — ABNORMAL HIGH (ref 70–99)
Total Bilirubin: 0.8 mg/dL (ref 0.3–1.2)

## 2012-07-04 LAB — GLUCOSE, CAPILLARY: Glucose-Capillary: 225 mg/dL — ABNORMAL HIGH (ref 70–99)

## 2012-07-04 LAB — PROTIME-INR
INR: 1.08 (ref 0.00–1.49)
Prothrombin Time: 14.2 seconds (ref 11.6–15.2)

## 2012-07-04 LAB — DIFFERENTIAL
Basophils Relative: 0 % (ref 0–1)
Eosinophils Absolute: 0 10*3/uL (ref 0.0–0.7)
Lymphs Abs: 1.1 10*3/uL (ref 0.7–4.0)
Neutrophils Relative %: 73 % (ref 43–77)

## 2012-07-04 LAB — CARDIAC PANEL(CRET KIN+CKTOT+MB+TROPI): CK, MB: 5.9 ng/mL — ABNORMAL HIGH (ref 0.3–4.0)

## 2012-07-04 LAB — CBC
MCH: 29.6 pg (ref 26.0–34.0)
Platelets: 151 10*3/uL (ref 150–400)
RBC: 4.06 MIL/uL (ref 3.87–5.11)

## 2012-07-04 LAB — MRSA PCR SCREENING: MRSA by PCR: NEGATIVE

## 2012-07-04 MED ORDER — NITROGLYCERIN IN D5W 200-5 MCG/ML-% IV SOLN: 2.0000 ug/min | INTRAVENOUS | Status: DC

## 2012-07-04 MED ORDER — SODIUM CHLORIDE 0.9 % IV SOLN: INTRAVENOUS | Status: DC

## 2012-07-04 MED ORDER — POLYETHYLENE GLYCOL 3350 17 G PO PACK
17.0000 g | PACK | Freq: Every day | ORAL | Status: DC | PRN
  Filled 2012-07-04: qty 1

## 2012-07-04 MED ORDER — ATORVASTATIN CALCIUM 40 MG PO TABS
40.0000 mg | ORAL_TABLET | Freq: Every day | ORAL | Status: DC
  Administered 2012-07-04 – 2012-07-09 (×6): 40 mg via ORAL
  Filled 2012-07-04 (×7): qty 1

## 2012-07-04 MED ORDER — FUROSEMIDE 40 MG PO TABS: 40.0000 mg | ORAL_TABLET | Freq: Once | ORAL | Status: DC

## 2012-07-04 MED ORDER — ONDANSETRON HCL 4 MG/2ML IJ SOLN: 4.0000 mg | Freq: Four times a day (QID) | INTRAMUSCULAR | Status: DC | PRN

## 2012-07-04 MED ORDER — ONDANSETRON HCL 4 MG PO TABS
4.0000 mg | ORAL_TABLET | Freq: Four times a day (QID) | ORAL | Status: DC | PRN
  Administered 2012-07-04: 4 mg via ORAL
  Filled 2012-07-04: qty 1

## 2012-07-04 MED ORDER — ACETAMINOPHEN 325 MG PO TABS
650.0000 mg | ORAL_TABLET | Freq: Four times a day (QID) | ORAL | Status: DC | PRN
  Administered 2012-07-07 – 2012-07-08 (×2): 650 mg via ORAL
  Filled 2012-07-04 (×2): qty 2

## 2012-07-04 MED ORDER — ACETAMINOPHEN 650 MG RE SUPP: 650.0000 mg | Freq: Four times a day (QID) | RECTAL | Status: DC | PRN

## 2012-07-04 MED ORDER — MORPHINE SULFATE 2 MG/ML IJ SOLN
2.0000 mg | INTRAMUSCULAR | Status: DC | PRN
Start: 2012-07-04 — End: 2012-07-10

## 2012-07-04 MED ORDER — NITROGLYCERIN IN D5W 200-5 MCG/ML-% IV SOLN
INTRAVENOUS | Status: AC
  Filled 2012-07-04: qty 250

## 2012-07-04 MED ORDER — SODIUM CHLORIDE 0.9 % IJ SOLN
3.0000 mL | Freq: Two times a day (BID) | INTRAMUSCULAR | Status: DC
  Administered 2012-07-04 – 2012-07-06 (×3): 3 mL via INTRAVENOUS

## 2012-07-04 MED ORDER — LISINOPRIL 2.5 MG PO TABS
2.5000 mg | ORAL_TABLET | Freq: Every day | ORAL | Status: DC
  Administered 2012-07-04 – 2012-07-10 (×7): 2.5 mg via ORAL
  Filled 2012-07-04 (×7): qty 1

## 2012-07-04 MED ORDER — INSULIN ASPART 100 UNIT/ML ~~LOC~~ SOLN
0.0000 [IU] | Freq: Every day | SUBCUTANEOUS | Status: DC
  Administered 2012-07-04: 2 [IU] via SUBCUTANEOUS
  Administered 2012-07-05: 5 [IU] via SUBCUTANEOUS
  Administered 2012-07-08: 2 [IU] via SUBCUTANEOUS

## 2012-07-04 MED ORDER — ASPIRIN EC 325 MG PO TBEC
325.0000 mg | DELAYED_RELEASE_TABLET | Freq: Every day | ORAL | Status: DC
  Administered 2012-07-05 – 2012-07-09 (×5): 325 mg via ORAL
  Filled 2012-07-04 (×7): qty 1

## 2012-07-04 MED ORDER — ENOXAPARIN SODIUM 80 MG/0.8ML ~~LOC~~ SOLN
75.0000 mg | Freq: Two times a day (BID) | SUBCUTANEOUS | Status: DC
  Administered 2012-07-05 – 2012-07-06 (×3): 75 mg via SUBCUTANEOUS
  Filled 2012-07-04 (×5): qty 0.8

## 2012-07-04 MED ORDER — INSULIN ASPART 100 UNIT/ML ~~LOC~~ SOLN
0.0000 [IU] | Freq: Three times a day (TID) | SUBCUTANEOUS | Status: DC
  Administered 2012-07-04: 3 [IU] via SUBCUTANEOUS
  Administered 2012-07-05: 5 [IU] via SUBCUTANEOUS
  Administered 2012-07-05: 15 [IU] via SUBCUTANEOUS
  Administered 2012-07-05: 5 [IU] via SUBCUTANEOUS
  Administered 2012-07-06 (×3): 3 [IU] via SUBCUTANEOUS
  Administered 2012-07-07: 11 [IU] via SUBCUTANEOUS
  Administered 2012-07-07 (×2): 3 [IU] via SUBCUTANEOUS
  Administered 2012-07-08: 2 [IU] via SUBCUTANEOUS
  Administered 2012-07-08: 5 [IU] via SUBCUTANEOUS
  Administered 2012-07-08: 3 [IU] via SUBCUTANEOUS
  Administered 2012-07-09 – 2012-07-10 (×3): 2 [IU] via SUBCUTANEOUS
  Administered 2012-07-10: 5 [IU] via SUBCUTANEOUS

## 2012-07-04 MED ORDER — METOPROLOL TARTRATE 12.5 MG HALF TABLET
12.5000 mg | ORAL_TABLET | Freq: Two times a day (BID) | ORAL | Status: DC
  Administered 2012-07-04 – 2012-07-10 (×12): 12.5 mg via ORAL
  Filled 2012-07-04 (×13): qty 1

## 2012-07-04 NOTE — Progress Notes (Signed)
ANTICOAGULATION CONSULT NOTE - Initial Consult  Pharmacy Consult for Lovenox Indication: chest pain/ACS/ NSTEMI   Not on File  Patient Measurements: Height: 5\' 3"  (160 cm) Weight: 164 lb 3.9 oz (74.5 kg) IBW/kg (Calculated) : 52.4   Vital Signs: Temp: 98.3 F (36.8 C) (08/21 1600) Temp src: Oral (08/21 1600) BP: 172/71 mmHg (08/21 1800) Pulse Rate: 72  (08/21 1600)  Labs:  Basename 07/04/12 1608 07/04/12 1607  HGB 12.0 --  HCT 35.2* --  PLT 151 --  APTT 37 --  LABPROT 14.2 --  INR 1.08 --  HEPARINUNFRC -- --  CREATININE 0.82 --  CKTOTAL -- 149  CKMB -- 5.9*  TROPONINI -- 6.72*    Estimated Creatinine Clearance: 44.1 ml/min (by C-G formula based on Cr of 0.82).   Medical History: Past Medical History  Diagnosis Date  . Hypertension   . Diabetes mellitus   . Blindness, bilateral    Assessment:   78 yr old woman transferred to [facility] from [facility]  Non-STEMI, noted likely to have occurred yesterday. Currently pain free,  for medical therapy. Lovenox 75 mg SQ given at [facility] at 2pm today. Creatinine clearance >30 ml/min, cut off for q12h versus daily Lovenox dosing.   H/H okay, baseline platelet count just above low normal.  Goal of Therapy:  Anti-Xa level 0.6-1.2 units/ml 4hrs after LMWH dose given Monitor platelets by anticoagulation protocol: Yes   Plan:    Lovenox 75 mg SQ q12hrs.  Will begin at 6am on 8/22.   CBC at least every 3 days while on Lovenox.     Will follow-up renal function for any need to adjust Lovenox dosing interval.  Dennie Fetters, RPh Pager: [phone] 07/04/2012,6:38 PM

## 2012-07-04 NOTE — H&P (Signed)
[facility] History and Physical  Julia Oliver WUJ:[id number] DOB: [birth year] DOA: 07/04/2012   PCP: No primary provider on file.   Chief Complaint: Altered mental  HPI:  This is a 78 year old female with past medical history of hypertension legally blind, diabetes last hemoglobin A1c of 8.5, a baseline creatinine of 0.7 and was transferred here from [facility] for a possible non-ST elevation MI. The patient also has dementia and cannot give a history as per her son this all started about a week ago she complained of chest pain and shortness of breath that lasted about 8 hours,  resolved after she had a bowel movement.  She woke up she was incoherent and was not able to get out of bed as she felt so weak.   Review of Systems:  Constitutional:  No weight loss, night sweats, Fevers, chills, fatigue.  HEENT:  No headaches, Difficulty swallowing,Tooth/dental problems,Sore throat,  No sneezing, itching, ear ache, nasal congestion, post nasal drip,  Cardio-vascular:  No chest pain, Orthopnea, PND, swelling in lower extremities, anasarca, dizziness, palpitations  GI:  No heartburn, indigestion, abdominal pain, nausea, vomiting, diarrhea, change in bowel habits, loss of appetite  Resp:   No excess mucus, no productive cough, No non-productive cough, No coughing up of blood.No change in color of mucus.No wheezing.No chest wall deformity  Skin:  no rash or lesions.  GU:  no dysuria, change in color of urine, no urgency or frequency. No flank pain.  Musculoskeletal:  No joint pain or swelling. No decreased range of motion. No back pain.  Psych:  No change in mood or affect. No depression or anxiety. No memory loss.    Past Medical History  Diagnosis Date  . Hypertension   . Diabetes mellitus    No past surgical history on file. Social History:  does not have a smoking history on file. She does not have any smokeless tobacco history on file. She reports that she does  not drink alcohol or use illicit drugs.  Not on File  No family history on file.  Prior to Admission medications   Not on File   Physical Exam: Filed Vitals:   07/04/12 1520 07/04/12 1530  BP: 162/67 176/92  Pulse: 76 72  Temp: 97.5 F (36.4 C)   TempSrc: Oral   Resp: 18 21  Height: 5\' 3"  (1.6 m)   Weight: 74.5 kg (164 lb 3.9 oz)   SpO2: 100% 100%   BP 176/92  Pulse 72  Temp 97.5 F (36.4 C) (Oral)  Resp 21  Ht 5\' 3"  (1.6 m)  Wt 74.5 kg (164 lb 3.9 oz)  BMI 29.09 kg/m2  SpO2 100%  General Appearance:    Alert, times 1  Head:    Normocephalic, without obvious abnormality, atraumatic  Eyes:    PERRL, conjunctiva/corneas clear, EOM's intact, fundi    benign, both eyes        Throat:   Lips, mucosa, and tongue normal; teeth and gums normal  Neck:   Supple, symmetrical, trachea midline, no adenopathy;    thyroid:  no enlargement/tenderness/nodules; no carotid   bruit or JVD  Back:     Symmetric, no curvature, ROM normal, no CVA tenderness  Lungs:     good air movement, and labored breathing with crackles bilaterally a one third of her lungs   Chest Wall:    No tenderness or deformity   Heart:    Regular rate and rhythm, S1 and S2 normal, no murmur, rub  or gallop     Abdomen:     Soft, non-tender, bowel sounds active all four quadrants,    no masses, no organomegaly        Extremities:   Extremities normal, atraumatic, no cyanosis or edema  Pulses:   2+ and symmetric all extremities  Skin:   Skin color, texture, turgor normal, no rashes or lesions  Lymph nodes:   Cervical, supraclavicular, and axillary nodes normal  Neurologic:   CNII-XII intact, normal strength, sensation and reflexes    throughout    Labs on Admission:  Basic Metabolic Panel: No results found for this basename: NA:5,K:2,CL:5,CO2:5,GLUCOSE:5,BUN:5,CREATININE:5,CALCIUM:5,MG:5,PHOS:5 in the last 168 hours Liver Function Tests: No results found for this basename:  AST:5,ALT:5,ALKPHOS:5,BILITOT:5,PROT:5,ALBUMIN:5 in the last 168 hours No results found for this basename: LIPASE:5,AMYLASE:5 in the last 168 hours No results found for this basename: AMMONIA:5 in the last 168 hours CBC: No results found for this basename: WBC:5,NEUTROABS:5,HGB:5,HCT:5,MCV:5,PLT:5 in the last 168 hours Cardiac Enzymes: No results found for this basename: CKTOTAL:5,CKMB:5,CKMBINDEX:5,TROPONINI:5 in the last 168 hours BNP: No components found with this basename: POCBNP:5 CBG: No results found for this basename: GLUCAP:5 in the last 168 hours  Radiological Exams on Admission: No results found.  EKG: Independently reviewed. Sinus rhythm with first degree AV block left axis deviation mild 1 mm elevation in lead 2 and aVF and ST segment depression in 1 and aVL  Assessment/Plan: Principal Problem: Acute coronary syndrome: -Her EKG shows first-degree AV block left axis deviation with a slight elevation of her ST segment in lead 2 and aVF and reciprocal changes in 1 and aVF, which is a change from her previous EKG in the computer.  She has elevated troponins of 5, MB'  6.1 from the lab at [facility]. She developed Q waves on EKG,  We'll go head and put on a step-down unit, continue Lovenox for acute coronary syndrome. Cycle cardiac enzymes x3, get a 2-D echo continue aspirin and statins. [facility] consult cardiology for further evaluation. Her GFR is greater than 60 ml/minute, I will  have discussed with the family how aggressive they will be depending on the trend of her cardiac enzymes. At this time it seems like her MBs are significantly lower than her troponins and shows Q waves changes compared to old EKGs. The most concerning for a completed event. We'll have to discuss with cardiology for further evaluation. She is not complaining of any chest pains we'll not start her on nitroglycerin. We'll go ahead and start her on low-dose beta blocker orally .  -Check chest x-ray as she does  have crackles in her lungs about a third way up. Before giving her IV fluids  HTN (hypertension): We'll go ahead and start her on a beta blocker and low-dose ACE. Will titrate as blood pressure tolerates it.   Diabetes mellitus type II: -Check hemoglobin A1c start her on sliding scale insulin no baseline insulin at this time. We'll check CBGs a.c. and at bedtime. The patient is currently in diet.  Hyponatremia: Overhead getting a chest x-ray urinary sodium. His Confusing picture as her creatinine is at baseline and she does have crackles on her physical exam. Her pro BMP from [facility] is elevated. Her bedding check urinary sodium and to see how her kidneys are perfusing. We'll check strict I.'s and O.'s to the  Time spend: Greater than 60 minutes  Code Status:  Family Communication: Patient's husband Daughter [phone] Disposition Plan: TBD  Marinda Elk, MD  [facility] Pager  [phone]  If 7PM-7AM, please contact night-coverage [email or web address] Password [facility] 07/04/2012, 3:45 PM

## 2012-07-04 NOTE — Progress Notes (Signed)
CRITICAL VALUE ALERT  Critical value received:  Trop 6.72  Date of notification: 07/04/12  Time of notification:  1715  Critical value read backyes  Nurse who received alert: P.Mikle Bosworth RN  MD notified (1st page):   Dr. Patty Sermons  Time of first page:  MD at bedside  MD notified (2nd page):  Time of second page:  Responding MD:  Dr. Patty Sermons  Time MD responded: [phone]

## 2012-07-04 NOTE — Consult Note (Signed)
Reason for Consult: Non-STEMI Referring Physician: [facility]  Julia Oliver is an 78 y.o. female.  HPI: This 78 year old woman was admitted in transfer from [facility] where she presented this morning with weakness.  She was found to have elevated cardiac enzymes and an abnormal electrocardiogram consistent with a non-ST elevation myocardial infarction.  The patient has had some recent problems with confusion and dementia.  Apparently about 8 days ago the patient complained to her family that she had had some chest discomfort.  However that resolved after she had a good bowel movement.  Yesterday she apparently had some shortness of breath and this morning she woke up feeling extremely weak.  The patient has a history of essential hypertension and diabetes mellitus.  She has had diabetes for more than 40 years and she is legally blind  Past Medical History  Diagnosis Date  . Hypertension   . Diabetes mellitus   . Blindness, bilateral     Past Surgical History  Procedure Date  . Right foot fracture   . Right leg fracture   . Fracture surgery     right lower extremity fracture repair  . Eye surgery     multiple surgeries for bilat blindness    Family History  Problem Relation Age of Onset  . Family history unknown: Yes    Social History:  reports that she has never smoked. She has never used smokeless tobacco. She reports that she does not drink alcohol or use illicit drugs.  Allergies: Not on File  Medications:  Scheduled:   . aspirin EC  325 mg Oral Daily  . atorvastatin  40 mg Oral q1800  . insulin aspart  0-15 Units Subcutaneous TID WC  . insulin aspart  0-5 Units Subcutaneous QHS  . metoprolol tartrate  12.5 mg Oral BID  . nitroGLYCERIN      . sodium chloride  3 mL Intravenous Q12H    Results for orders placed during the hospital encounter of 07/04/12 (from the past 48 hour(s))  MRSA PCR SCREENING     Status: Normal   Collection Time   07/04/12  3:27 PM      Component Value Range Comment   MRSA by PCR NEGATIVE  NEGATIVE   PRO B NATRIURETIC PEPTIDE     Status: Abnormal   Collection Time   07/04/12  4:05 PM      Component Value Range Comment   Pro B Natriuretic peptide (BNP) 4677.0 (*) 0 - 450 pg/mL   CARDIAC PANEL(CRET KIN+CKTOT+MB+TROPI)     Status: Abnormal   Collection Time   07/04/12  4:07 PM      Component Value Range Comment   Total CK 149  7 - 177 U/L    CK, MB 5.9 (*) 0.3 - 4.0 ng/mL    Troponin I 6.72 (*) <0.30 ng/mL    Relative Index 4.0 (*) 0.0 - 2.5   COMPREHENSIVE METABOLIC PANEL     Status: Abnormal   Collection Time   07/04/12  4:08 PM      Component Value Range Comment   Sodium 128 (*) 135 - 145 mEq/L    Potassium 4.0  3.5 - 5.1 mEq/L    Chloride 94 (*) 96 - 112 mEq/L    CO2 25  19 - 32 mEq/L    Glucose, Bld 186 (*) 70 - 99 mg/dL    BUN 19  6 - 23 mg/dL    Creatinine, Ser 5.78  0.50 - 1.10 mg/dL  Calcium 9.1  8.4 - 10.5 mg/dL    Total Protein 6.9  6.0 - 8.3 g/dL    Albumin 3.2 (*) 3.5 - 5.2 g/dL    AST 29  0 - 37 U/L    ALT 19  0 - 35 U/L    Alkaline Phosphatase 71  39 - 117 U/L    Total Bilirubin 0.8  0.3 - 1.2 mg/dL    GFR calc non Af Amer 61 (*) >90 mL/min    GFR calc Af Amer 71 (*) >90 mL/min   CBC     Status: Abnormal   Collection Time   07/04/12  4:08 PM      Component Value Range Comment   WBC 6.7  4.0 - 10.5 K/uL    RBC 4.06  3.87 - 5.11 MIL/uL    Hemoglobin 12.0  12.0 - 15.0 g/dL    HCT 16.1 (*) 09.6 - 46.0 %    MCV 86.7  78.0 - 100.0 fL    MCH 29.6  26.0 - 34.0 pg    MCHC 34.1  30.0 - 36.0 g/dL    RDW 04.5  40.9 - 81.1 %    Platelets 151  150 - 400 K/uL   DIFFERENTIAL     Status: Normal   Collection Time   07/04/12  4:08 PM      Component Value Range Comment   Neutrophils Relative 73  43 - 77 %    Neutro Abs 4.9  1.7 - 7.7 K/uL    Lymphocytes Relative 16  12 - 46 %    Lymphs Abs 1.1  0.7 - 4.0 K/uL    Monocytes Relative 10  3 - 12 %    Monocytes Absolute 0.7  0.1 - 1.0  K/uL    Eosinophils Relative 1  0 - 5 %    Eosinophils Absolute 0.0  0.0 - 0.7 K/uL    Basophils Relative 0  0 - 1 %    Basophils Absolute 0.0  0.0 - 0.1 K/uL   PROTIME-INR     Status: Normal   Collection Time   07/04/12  4:08 PM      Component Value Range Comment   Prothrombin Time 14.2  11.6 - 15.2 seconds    INR 1.08  0.00 - 1.49   APTT     Status: Normal   Collection Time   07/04/12  4:08 PM      Component Value Range Comment   aPTT 37  24 - 37 seconds   GLUCOSE, CAPILLARY     Status: Abnormal   Collection Time   07/04/12  4:50 PM      Component Value Range Comment   Glucose-Capillary 172 (*) 70 - 99 mg/dL     Portable Chest 1 View  07/04/2012  *RADIOLOGY REPORT*  Clinical Data: Shortness of breath.  High blood pressure. Diabetic.  PORTABLE CHEST - 1 VIEW  Comparison: 08/04/2011.  Findings: Cardiomegaly.  Pulmonary vascular congestion.  Early pulmonary edema not entirely excluded.  Tortuous aorta.  No segmental infiltrate or pneumothorax.  IMPRESSION: Cardiomegaly.  Pulmonary vascular congestion.  Early pulmonary edema not entirely excluded.  Tortuous aorta.  No segmental infiltrate or pneumothorax.   Original Report Authenticated By: Fuller Canada, M.D.     Review of systems reveals that the patient gets around in her home with a walker or with a wheelchair.  She has had previous fracture of her right lower extremity which has left her with a difficulty  walking.  All other systems negative in detail  Blood pressure 154/71, pulse 72, temperature 98.3 F (36.8 C), temperature source Oral, resp. rate 21, height 5\' 3"  (1.6 m), weight 164 lb 3.9 oz (74.5 kg), SpO2 98.00%.  The patient appears to be in no distress.  She is moderately confused  Head and neck exam reveal that the carotids reveal no bruits.  The jugular venous pressure is normal.  The thyroid is not enlarged or tender.  Her vision is significantly decreased bilaterally.  Chest reveals crackling rales at both  bases  Heart reveals no abnormal lift or heave.  First and second heart sounds are normal.  There is no murmur gallop rub or click.  The abdomen is soft and nontender.  Bowel sounds are normoactive.  There is no hepatosplenomegaly or mass.  There are no abdominal bruits.  Extremities reveal no phlebitis or edema.  The right ankle is significantly deformed from prior surgery  Pedal pulses are good.  There is no cyanosis or clubbing.  Neurologic exam is normal strength and no lateralizing weakness.  No sensory deficits.  Legally blind  Integument reveals no rash  EKG shows normal sinus rhythm with Q waves in 3 and aVF with ST elevation in 3 and aVF and ST depression in 1 aVL and nonspecific lateral T wave flattening.  There is also poor R-wave progression V1 through V5 suggesting possible old anterolateral myocardial infarction  Assessment/Plan: 1. non-STEMI which probably occurred yesterday 2. long-standing insulin-dependent diabetes mellitus 3. essential hypertension 4. Dementia 5. congestive heart failure, type to be determined, with pulmonary rales and radiographic evidence of pulmonary vascular congestion and elevated B. natruretic peptide  Recommendation: Medical therapy for non-STEMI including beta blocker, aspirin, statins, and low dose ACE inhibitor.  She is not presently having any chest pain and we will not start nitroglycerin at this time.  Will give one dose of Lasix for mild CHF and for her blood pressure which is presently elevated. We'll obtain serial cardiac enzymes and EKGs and and echocardiogram which is already ordered.  Baseline kidney function is normal.  We'll use therapeutic dose Lovenox initially as we watch cardiac enzymes and await evaluation of LV function. We will follow with you.  [name] 07/04/2012, 5:28 PM

## 2012-07-05 DIAGNOSIS — I200 Unstable angina: Secondary | ICD-10-CM

## 2012-07-05 DIAGNOSIS — I359 Nonrheumatic aortic valve disorder, unspecified: Secondary | ICD-10-CM

## 2012-07-05 LAB — CARDIAC PANEL(CRET KIN+CKTOT+MB+TROPI)
CK, MB: 4.3 ng/mL — ABNORMAL HIGH (ref 0.3–4.0)
CK, MB: 5.4 ng/mL — ABNORMAL HIGH (ref 0.3–4.0)
Relative Index: 4 — ABNORMAL HIGH (ref 0.0–2.5)
Total CK: 103 U/L (ref 7–177)
Troponin I: 4.24 ng/mL (ref <0.30)
Troponin I: 4.53 ng/mL (ref <0.30)

## 2012-07-05 LAB — HEMOGLOBIN A1C
Hgb A1c MFr Bld: 8.9 % — ABNORMAL HIGH (ref <5.7)
Mean Plasma Glucose: 209 mg/dL — ABNORMAL HIGH (ref <117)

## 2012-07-05 LAB — COMPREHENSIVE METABOLIC PANEL
BUN: 16 mg/dL (ref 6–23)
Calcium: 9.1 mg/dL (ref 8.4–10.5)
GFR calc Af Amer: 70 mL/min — ABNORMAL LOW (ref >90)
Glucose, Bld: 251 mg/dL — ABNORMAL HIGH (ref 70–99)
Total Protein: 6.8 g/dL (ref 6.0–8.3)

## 2012-07-05 LAB — CBC
HCT: 35.4 % — ABNORMAL LOW (ref 36.0–46.0)
Hemoglobin: 12.3 g/dL (ref 12.0–15.0)
RDW: 12.7 % (ref 11.5–15.5)
WBC: 7.4 10*3/uL (ref 4.0–10.5)

## 2012-07-05 LAB — GLUCOSE, CAPILLARY
Glucose-Capillary: 238 mg/dL — ABNORMAL HIGH (ref 70–99)
Glucose-Capillary: 312 mg/dL — ABNORMAL HIGH (ref 70–99)

## 2012-07-05 MED ORDER — INSULIN GLARGINE 100 UNIT/ML ~~LOC~~ SOLN
10.0000 [IU] | Freq: Every day | SUBCUTANEOUS | Status: DC
  Administered 2012-07-05: 10 [IU] via SUBCUTANEOUS

## 2012-07-05 NOTE — Progress Notes (Signed)
[facility] PROGRESS NOTE  Julia Oliver ZOX:[id number] DOB: [birth year] DOA: 07/04/2012 PCP: No primary provider on file.  Assessment/Plan: Principal Problem:  *Acute coronary syndrome NSTEMI- conservative management for now. Appreciate cards f/u.   Active Problems:  HTN (hypertension) Cont current treatment and follow   Diabetes mellitus type II A1c elevated; will need to add Lantus for now - cont sliding scale. PO intake variable today.    Hyponatremia Suspected due to fluid overload- diuresis per cardiology group  Delirium Per son she is very oriented and without memory difficulties at home. May be related to current fluid overload? Check UA- pt incontinent - will obtain I and O cath. Hold off on foley due to confusion and agitation and risk of pulling it out.    Family Communication: son Disposition Plan: follow in SDU   Brief narrative: This is a 78 year old female with past medical history of hypertension legally blind, diabetes last hemoglobin A1c of 8.5, a baseline creatinine of 0.7 and was transferred here from [facility] for a possible non-ST elevation MI. The patient also has dementia and cannot give a history as per her son this all started about a week ago she complained of chest pain and shortness of breath that lasted about 8 hours, resolved after she had a bowel movement. She woke up she was incoherent and was not able to get out of bed as she felt so weak.    Consultants:  Cardiology   HPI/Subjective: Patient very confused, mildly agitated but following commands.   Objective: Filed Vitals:   07/05/12 1600 07/05/12 1700 07/05/12 1800 07/05/12 1804  BP: 113/53 79/39  121/58  Pulse: 67 73 84 83  Temp:    98.3 F (36.8 C)  TempSrc:    Oral  Resp: 22 21 25 23   Height:      Weight:      SpO2: 100% 83% 91% 98%    Intake/Output Summary (Last 24 hours) at 07/05/12 1840 Last data filed at 07/05/12 1600  Gross per 24 hour  Intake     595 ml  Output      0 ml  Net    595 ml    Exam:   General:  Alert, confused  Cardiovascular: RRR, no murmur  Respiratory: decreased breath sounds- no crackles or wheeze noted  Abdomen: soft, NT, ND, BS+  Ext:no c/c/e  Data Reviewed: Basic Metabolic Panel:  Lab 07/05/12 8119 07/04/12 1608  NA 130* 128*  K 4.4 4.0  CL 95* 94*  CO2 27 25  GLUCOSE 251* 186*  BUN 16 19  CREATININE 0.83 0.82  CALCIUM 9.1 9.1  MG -- --  PHOS -- --   Liver Function Tests:  Lab 07/05/12 0500 07/04/12 1608  AST 21 29  ALT 17 19  ALKPHOS 70 71  BILITOT 1.0 0.8  PROT 6.8 6.9  ALBUMIN 3.2* 3.2*   No results found for this basename: LIPASE:5,AMYLASE:5 in the last 168 hours No results found for this basename: AMMONIA:5 in the last 168 hours CBC:  Lab 07/05/12 0500 07/04/12 1608  WBC 7.4 6.7  NEUTROABS -- 4.9  HGB 12.3 12.0  HCT 35.4* 35.2*  MCV 87.8 86.7  PLT 161 151   Cardiac Enzymes:  Lab 07/05/12 1011 07/04/12 2336 07/04/12 1607  CKTOTAL 103 134 149  CKMB 4.3* 5.4* 5.9*  CKMBINDEX -- -- --  TROPONINI 4.24* 4.53* 6.72*   BNP (last 3 results)  Basename 07/04/12 1605  PROBNP 4677.0*   CBG:  Lab  07/05/12 1738 07/05/12 1153 07/05/12 0733 07/04/12 2147 07/04/12 1650  GLUCAP 358* 226* 238* 225* 172*    Recent Results (from the past 240 hour(s))  MRSA PCR SCREENING     Status: Normal   Collection Time   07/04/12  3:27 PM      Component Value Range Status Comment   MRSA by PCR NEGATIVE  NEGATIVE Final      Studies: Portable Chest 1 View  07/04/2012  *RADIOLOGY REPORT*  Clinical Data: Shortness of breath.  High blood pressure. Diabetic.  PORTABLE CHEST - 1 VIEW  Comparison: 08/04/2011.  Findings: Cardiomegaly.  Pulmonary vascular congestion.  Early pulmonary edema not entirely excluded.  Tortuous aorta.  No segmental infiltrate or pneumothorax.  IMPRESSION: Cardiomegaly.  Pulmonary vascular congestion.  Early pulmonary edema not entirely excluded.  Tortuous aorta.  No  segmental infiltrate or pneumothorax.   Original Report Authenticated By: Fuller Canada, M.D.     Scheduled Meds:   . aspirin EC  325 mg Oral Daily  . atorvastatin  40 mg Oral q1800  . enoxaparin (LOVENOX) injection  75 mg Subcutaneous Q12H  . furosemide  40 mg Oral Once  . insulin aspart  0-15 Units Subcutaneous TID WC  . insulin aspart  0-5 Units Subcutaneous QHS  . lisinopril  2.5 mg Oral Daily  . metoprolol tartrate  12.5 mg Oral BID  . nitroGLYCERIN      . sodium chloride  3 mL Intravenous Q12H   Continuous Infusions:   . sodium chloride      ________________________________________________________________________  Time spent: 35 min   [facility]  [facility] Pager [phone] If 8PM-8AM, please contact night-coverage at [email or web address], password [facility] 07/05/2012, 6:40 PM  LOS: 1 day

## 2012-07-05 NOTE — Progress Notes (Signed)
  Echocardiogram 2D Echocardiogram has been performed.  Emelia Loron 07/05/2012, 9:31 AM

## 2012-07-05 NOTE — Progress Notes (Signed)
Subjective:  Remains confused.  No chest pain.  Good diuresis overnight but incontinent. 2D echo pending. BP coming down after diuresis.  Objective:  Vital Signs in the last 24 hours: Temp:  [97.5 F (36.4 C)-98.7 F (37.1 C)] 97.5 F (36.4 C) (08/22 0735) Pulse Rate:  [72-82] 77  (08/22 0735) Resp:  [15-24] 18  (08/22 0735) BP: (142-179)/(49-102) 144/49 mmHg (08/22 0600) SpO2:  [98 %-100 %] 98 % (08/22 0735) Weight:  [160 lb 15 oz (73 kg)-164 lb 3.9 oz (74.5 kg)] 160 lb 15 oz (73 kg) (08/22 0500)  Intake/Output from previous day: 08/21 0701 - 08/22 0700 In: 220 [P.O.:220] Out: -  Intake/Output from this shift:       . aspirin EC  325 mg Oral Daily  . atorvastatin  40 mg Oral q1800  . enoxaparin (LOVENOX) injection  75 mg Subcutaneous Q12H  . furosemide  40 mg Oral Once  . insulin aspart  0-15 Units Subcutaneous TID WC  . insulin aspart  0-5 Units Subcutaneous QHS  . lisinopril  2.5 mg Oral Daily  . metoprolol tartrate  12.5 mg Oral BID  . nitroGLYCERIN      . sodium chloride  3 mL Intravenous Q12H      . sodium chloride    . DISCONTD: sodium chloride    . DISCONTD: nitroGLYCERIN      Physical Exam: The patient appears to be in no distress. Confused.  Head and neck exam reveal that the carotids reveal no bruits. The jugular venous pressure is normal. The thyroid is not enlarged or tender. Her vision is significantly decreased bilaterally.  Chest reveals mild rales at both bases  Heart reveals no abnormal lift or heave. First and second heart sounds are normal. There is no murmur gallop rub or click.  The abdomen is soft and nontender. Bowel sounds are normoactive. There is no hepatosplenomegaly or mass. There are no abdominal bruits.  Extremities reveal no phlebitis or edema. The right ankle is significantly deformed from prior surgery Pedal pulses are good. There is no cyanosis or clubbing.  Neurologic exam is normal strength and no lateralizing weakness. No  sensory deficits. Legally blind  Integument reveals no rash   Lab Results:  Basename 07/05/12 0500 07/04/12 1608  WBC 7.4 6.7  HGB 12.3 12.0  PLT 161 151    Basename 07/05/12 0500 07/04/12 1608  NA 130* 128*  K 4.4 4.0  CL 95* 94*  CO2 27 25  GLUCOSE 251* 186*  BUN 16 19  CREATININE 0.83 0.82    Basename 07/04/12 2336 07/04/12 1607  TROPONINI 4.53* 6.72*   Hepatic Function Panel  Basename 07/05/12 0500  PROT 6.8  ALBUMIN 3.2*  AST 21  ALT 17  ALKPHOS 70  BILITOT 1.0  BILIDIR --  IBILI --   No results found for this basename: CHOL in the last 72 hours No results found for this basename: PROTIME in the last 72 hours  Imaging: Imaging results have been reviewed  Cardiac Studies:  Assessment/Plan:  Patient Active Hospital Problem List: 1. non-STEMI .  No chest pain.  Troponin starting to trend down. 2. long-standing insulin-dependent diabetes mellitus  3. essential hypertension, improving 4. Dementia  5. congestive heart failure, type to be determined, with pulmonary rales and radiographic evidence of pulmonary vascular congestion and elevated B. natruretic peptide.  Plan:  Continue conservative medical management. 2D echo today to evaluate LV function. Daily EKG.    LOS: 1 day  [name] 07/05/2012, 8:04 AM

## 2012-07-05 NOTE — Progress Notes (Signed)
Inpatient Diabetes Program Recommendations  AACE/ADA: New Consensus Statement on Inpatient Glycemic Control (2013)  Target Ranges:  Prepandial:   less than 140 mg/dL      Peak postprandial:   less than 180 mg/dL (1-2 hours)      Critically ill patients:  140 - 180 mg/dL  Results for Julia Oliver, Julia Oliver (MRN [id number]) as of 07/05/2012 14:44  Ref. Range 07/04/2012 21:47 07/05/2012 07:33 07/05/2012 11:53  Glucose-Capillary Latest Range: 70-99 mg/dL 213 (H) 086 (H) 578 (H)    Inpatient Diabetes Program Recommendations Insulin - Basal: Resume home dose 75/25 (will be substituted w 70/30) OR use Lantus 30 units   Thank you  Piedad Climes [facility] Inpatient Diabetes Coordinator [phone]

## 2012-07-06 ENCOUNTER — Encounter (HOSPITAL_COMMUNITY): Payer: Self-pay | Admitting: *Deleted

## 2012-07-06 DIAGNOSIS — I200 Unstable angina: Secondary | ICD-10-CM

## 2012-07-06 LAB — URINALYSIS, ROUTINE W REFLEX MICROSCOPIC
Bilirubin Urine: NEGATIVE
Glucose, UA: 100 mg/dL — AB
Ketones, ur: NEGATIVE mg/dL
pH: 6 (ref 5.0–8.0)

## 2012-07-06 LAB — GLUCOSE, CAPILLARY
Glucose-Capillary: 153 mg/dL — ABNORMAL HIGH (ref 70–99)
Glucose-Capillary: 166 mg/dL — ABNORMAL HIGH (ref 70–99)
Glucose-Capillary: 156 mg/dL — ABNORMAL HIGH (ref 70–99)
Glucose-Capillary: 185 mg/dL — ABNORMAL HIGH (ref 70–99)

## 2012-07-06 LAB — URINE MICROSCOPIC-ADD ON

## 2012-07-06 MED ORDER — POLYETHYLENE GLYCOL 3350 17 G PO PACK
17.0000 g | PACK | Freq: Two times a day (BID) | ORAL | Status: DC
  Administered 2012-07-07 – 2012-07-10 (×3): 17 g via ORAL
  Filled 2012-07-06 (×10): qty 1

## 2012-07-06 MED ORDER — MAGNESIUM CITRATE PO SOLN
1.0000 | Freq: Once | ORAL | Status: AC | PRN
  Filled 2012-07-06: qty 296

## 2012-07-06 MED ORDER — SENNOSIDES-DOCUSATE SODIUM 8.6-50 MG PO TABS
1.0000 | ORAL_TABLET | Freq: Two times a day (BID) | ORAL | Status: DC
  Administered 2012-07-06 – 2012-07-10 (×4): 1 via ORAL
  Filled 2012-07-06 (×10): qty 1

## 2012-07-06 MED ORDER — INSULIN GLARGINE 100 UNIT/ML ~~LOC~~ SOLN: 25.0000 [IU] | Freq: Two times a day (BID) | SUBCUTANEOUS | Status: DC

## 2012-07-06 MED ORDER — INSULIN GLARGINE 100 UNIT/ML ~~LOC~~ SOLN
18.0000 [IU] | Freq: Every day | SUBCUTANEOUS | Status: DC
  Administered 2012-07-06 – 2012-07-09 (×4): 18 [IU] via SUBCUTANEOUS

## 2012-07-06 MED ORDER — FUROSEMIDE 20 MG PO TABS
20.0000 mg | ORAL_TABLET | Freq: Every day | ORAL | Status: DC
  Administered 2012-07-06: 20 mg via ORAL
  Filled 2012-07-06 (×2): qty 1

## 2012-07-06 MED ORDER — ENOXAPARIN SODIUM 30 MG/0.3ML ~~LOC~~ SOLN
30.0000 mg | SUBCUTANEOUS | Status: DC
  Administered 2012-07-06 – 2012-07-09 (×4): 30 mg via SUBCUTANEOUS
  Filled 2012-07-06 (×6): qty 0.3

## 2012-07-06 NOTE — Progress Notes (Signed)
[facility] Progress Note [place] TEAM 1 - Stepdown/ICU TEAM   Elena Davia RUE:[id number] DOB: 12-03-1919 DOA: 07/04/2012 PCP: No primary provider on file.  Brief narrative: 78 year old female with past medical history of hypertension, legally blind, diabetes last hemoglobin A1c of 8.5, a baseline creatinine of 0.7 who was transferred here from [facility] for a possible non-ST elevation MI. The patient also has dementia and cannot give a history as per her son this all started about a week ago she complained of chest pain and shortness of breath that lasted about 8 hours, resolved after she had a bowel movement. She woke up but was incoherent and was not able to get out of bed as she felt so weak.   Assessment/Plan:  Acute coronary syndrome  NSTEMI - conservative management for now - cardiology is following - no wall motion abnormalities appreciated on echocardiogram  HTN (hypertension)  Cont current treatment and follow - well-controlled at this  Diabetes mellitus type II  Poorly controlled but does appear to be improving -  A1c noted to be elevated at 8.9  Hyponatremia  Suspected due to fluid overload - diuresis per cardiology - followup in the morning  Dementia v/s acute Delirium  Per son she is very oriented and without memory difficulties at home - urinalysis has been ordered but not yet collected - I will also check B12 and folic acid levels  Acute on chronic diastolic heart failure Diuresis being directed by cardiology   Code Status: Unclear Family Communication: Patient lives with her son Disposition Plan: Stable for transfer to telemetry bed  Consultants: [place] cardiology  Procedures: None  Antibiotics: None  DVT prophylaxis: Lovenox  HPI/Subjective: The patient is alert and conversant.  She admits to some visual hallucinations.  She denies shortness of breath or chest pain.  Her primary complaint is that of constipation and she is  quite fixated on obtaining relief.   Objective: Blood pressure 118/72, pulse 72, temperature 97.5 F (36.4 C), temperature source Oral, resp. rate 20, height 5\' 3"  (1.6 m), weight 73.4 kg (161 lb 13.1 oz), SpO2 98.00%.  Intake/Output Summary (Last 24 hours) at 07/06/12 1058 Last data filed at 07/06/12 0800  Gross per 24 hour  Intake    495 ml  Output      0 ml  Net    495 ml     Exam: General: No acute respiratory distress at rest Lungs: Mild bibasilar crackles with good air movement throughout other fields and no wheeze Cardiovascular: Regular rate and rhythm without murmur gallop or rub Abdomen: Nontender, nondistended, soft, bowel sounds positive, no rebound, no ascites, no appreciable mass Extremities: Trace bilateral lower extremity edema without cyanosis or clubbing  Data Reviewed: Basic Metabolic Panel:  Lab 07/05/12 1478 07/04/12 1608  NA 130* 128*  K 4.4 4.0  CL 95* 94*  CO2 27 25  GLUCOSE 251* 186*  BUN 16 19  CREATININE 0.83 0.82  CALCIUM 9.1 9.1  MG -- --  PHOS -- --   Liver Function Tests:  Lab 07/05/12 0500 07/04/12 1608  AST 21 29  ALT 17 19  ALKPHOS 70 71  BILITOT 1.0 0.8  PROT 6.8 6.9  ALBUMIN 3.2* 3.2*   CBC:  Lab 07/05/12 0500 07/04/12 1608  WBC 7.4 6.7  NEUTROABS -- 4.9  HGB 12.3 12.0  HCT 35.4* 35.2*  MCV 87.8 86.7  PLT 161 151   Cardiac Enzymes:  Lab 07/05/12 1011 07/04/12 2336 07/04/12 1607  CKTOTAL 103 134  149  CKMB 4.3* 5.4* 5.9*  CKMBINDEX -- -- --  TROPONINI 4.24* 4.53* 6.72*   BNP (last 3 results)  Basename 07/04/12 1605  PROBNP 4677.0*   CBG:  Lab 07/06/12 0819 07/05/12 2240 07/05/12 1738 07/05/12 1153 07/05/12 0733  GLUCAP 153* 312* 358* 226* 238*    Recent Results (from the past 240 hour(s))  MRSA PCR SCREENING     Status: Normal   Collection Time   07/04/12  3:27 PM      Component Value Range Status Comment   MRSA by PCR NEGATIVE  NEGATIVE Final      Studies:  Recent x-ray studies have been reviewed  in detail by the Attending Physician  Scheduled Meds:  Reviewed in detail by the Attending Physician   Lonia Blood, MD [facility] Office  [phone] Pager [phone]  On-Call/Text Page:      [email or web address]      password [email or web address]  If 7PM-7AM, please contact night-coverage [email or web address] Password [facility] 07/06/2012, 10:58 AM   LOS: 2 days

## 2012-07-06 NOTE — Progress Notes (Signed)
Inpatient Diabetes Program Recommendations  AACE/ADA: New Consensus Statement on Inpatient Glycemic Control (2013)  Target Ranges:  Prepandial:   less than 140 mg/dL      Peak postprandial:   less than 180 mg/dL (1-2 hours)      Critically ill patients:  140 - 180 mg/dL   Elevated postprandial CBGs   Considering Novolog Meal Coverage TID to help prevent elevated postprandial CBGs.    Note: Patient received Lantus 10 units last night and fasting CBG=153 this morning.  Concerned that new order will be too much.  Patient is normally on more basal than that at home but for some reason Lantus 10 units was ok so far.   Diabetes Coordinator shared concern with Dr. Sharon Seller through [facility] text page.  Thank you  Piedad Climes [facility] Inpatient Diabetes Coordinator [phone]

## 2012-07-06 NOTE — Progress Notes (Signed)
Nutrition Brief Note  RD drawn to chart 2/2 Low [place].  Pt states that her appetite has been stable. States her usual weight is between 160 - 167 lb. Current weight is within that range at 161 lb. Discussed her dietary recall PTA; pt eats small, frequent meals - often provided by church members and family, and stays away from sweets.  Body mass index is 28.66 kg/(m^2). Pt meets criteria for overweight based on current BMI.   Current diet order is Carbohydrate Modified Medium, patient is consuming approximately 80% of meals at this time. Labs and medications reviewed.   No nutrition interventions warranted at this time. If nutrition issues arise, please consult RD.   Jarold Motto MS, RD, LDN Pager: [phone] After-hours pager: [phone]

## 2012-07-06 NOTE — Care Management Note (Unsigned)
    Page 1 of 1   07/09/2012     11:39:52 AM   CARE MANAGEMENT NOTE 07/09/2012  Patient:  Julia Oliver, Julia Oliver   Account Number:  [id number]  Date Initiated:  07/04/2012  Documentation initiated by:  Junius Creamer  Subjective/Objective Assessment:   adm w elev troponins     Action/Plan:   lives w fam   Anticipated DC Date:  07/10/2012   Anticipated DC Plan:  SKILLED NURSING FACILITY  In-house referral  Clinical Social Worker      DC Planning Services  CM consult      Choice offered to / List presented to:             Status of service:  In process, will continue to follow Medicare Important Message given?   (If response is "NO", the following Medicare IM given date fields will be blank) Date Medicare IM given:   Date Additional Medicare IM given:    Discharge Disposition:    Per UR Regulation:  Reviewed for med. necessity/level of care/duration of stay  If discussed at Long Length of Stay Meetings, dates discussed:    Comments:  07/09/12 Elisha Cooksey,RN,BSN 1100 SPOKE WITH DAUGHTER TO FINALIZE DC PLANS. (KAREN Baig, [place] [phone]):   DTR STATES SHE THINKS PT NEEDS TO GO TO SHORT TERM SNF FOR REHAB.  SHE PREFERS [facility] IN [place], AS PT HAS "LOTS OF FRIENDS" THERE.  WILL CONSULT CSW TO FACILTATE DC TO SNF.  DR AKULA NOTIFIED OF PLAN TO DC TO SNF.  CSW TO FOLLOW UP WITH DAUGHTER WITH BED OFFERS.  8/23 14:44p debbie dowell rn,bsn [phone] pt has been confused, lives w 58 yo husband, several recent falls. phy ther to eval and this will help w dc planning needs. no fam in room.  8/21 16:18p debbie dowell rn,bsn [phone]

## 2012-07-06 NOTE — Progress Notes (Signed)
Subjective:  This am the patient is oriented to time and place. Denies any chest pain or dyspnea.  Her main focus and complaint is on no BM since admission. Rhythm is stable. BP is still soft.  Objective:  Vital Signs in the last 24 hours: Temp:  [97.5 F (36.4 C)-98.3 F (36.8 C)] 97.5 F (36.4 C) (08/23 0800) Pulse Rate:  [64-91] 68  (08/23 0800) Resp:  [16-25] 16  (08/23 0800) BP: (79-125)/(39-75) 100/46 mmHg (08/23 0800) SpO2:  [80 %-100 %] 100 % (08/23 0800) Weight:  [161 lb 13.1 oz (73.4 kg)] 161 lb 13.1 oz (73.4 kg) (08/23 0500)  Intake/Output from previous day: 08/22 0701 - 08/23 0700 In: 495 [P.O.:495] Out: -  Intake/Output from this shift:       . aspirin EC  325 mg Oral Daily  . atorvastatin  40 mg Oral q1800  . enoxaparin (LOVENOX) injection  75 mg Subcutaneous Q12H  . furosemide  40 mg Oral Once  . insulin aspart  0-15 Units Subcutaneous TID WC  . insulin aspart  0-5 Units Subcutaneous QHS  . insulin glargine  10 Units Subcutaneous QHS  . lisinopril  2.5 mg Oral Daily  . metoprolol tartrate  12.5 mg Oral BID  . sodium chloride  3 mL Intravenous Q12H      . sodium chloride      Physical Exam: The patient appears to be in no distress. Oriented this am  Head and neck exam reveal that the carotids reveal no bruits. The jugular venous pressure is normal. The thyroid is not enlarged or tender. Her vision is significantly decreased bilaterally.  Chest reveals mild rales at both bases  Heart reveals no abnormal lift or heave. First and second heart sounds are normal. There is no murmur gallop rub or click.  The abdomen is soft and nontender. Bowel sounds are normoactive. There is no hepatosplenomegaly or mass. There are no abdominal bruits.  Extremities reveal no phlebitis or edema. The right ankle is significantly deformed from prior surgery Pedal pulses are good. There is no cyanosis or clubbing.  Neurologic exam is normal strength and no lateralizing  weakness. No sensory deficits. Legally blind  Integument reveals no rash   Lab Results:  Basename 07/05/12 0500 07/04/12 1608  WBC 7.4 6.7  HGB 12.3 12.0  PLT 161 151    Basename 07/05/12 0500 07/04/12 1608  NA 130* 128*  K 4.4 4.0  CL 95* 94*  CO2 27 25  GLUCOSE 251* 186*  BUN 16 19  CREATININE 0.83 0.82    Basename 07/05/12 1011 07/04/12 2336  TROPONINI 4.24* 4.53*   Hepatic Function Panel  Basename 07/05/12 0500  PROT 6.8  ALBUMIN 3.2*  AST 21  ALT 17  ALKPHOS 70  BILITOT 1.0  BILIDIR --  IBILI --   No results found for this basename: CHOL in the last 72 hours No results found for this basename: PROTIME in the last 72 hours  Imaging: Imaging results have been reviewed  Cardiac Studies: [facility]* *[facility]* [address] [place], [place] [address] [phone]  ------------------------------------------------------------ Transthoracic Echocardiography  Patient: Julia Oliver, Julia Oliver MR #: [id number] Study Date: 07/05/2012 Gender: F Age: 78 Height: 160cm Weight: 73kg BSA: 1.78m^2 Pt. Status: Room: 2927  PERFORMING [facility] ATTENDING Calvert Cantor SONOGRAPHER Emelia Loron, RDCS ORDERING Tresa Moore cc:  ------------------------------------------------------------ LV EF: 50% - 55%  ------------------------------------------------------------ Indications: MI - acute 410.91.  ------------------------------------------------------------ History: Risk  factors: Acute coronary syndrome. Hypertension. Diabetes mellitus.  ------------------------------------------------------------ Study Conclusions  - Left ventricle: The cavity size was normal. There was mild focal basal hypertrophy of the septum. Systolic function was normal. The estimated ejection fraction was in the range of 50% to 55%. Regional wall motion abnormalities cannot be excluded. Doppler parameters are  consistent with abnormal left ventricular relaxation (grade 1 diastolic dysfunction). Doppler parameters are consistent with high ventricular filling pressure. - Aortic valve: Mild regurgitation. - Mitral valve: Moderately calcified annulus. Mildly thickened leaflets . Mild regurgitation. - Left atrium: The atrium was mildly dilated. - Pulmonary arteries: Systolic pressure was mildly increased.   Assessment/Plan:  Patient Active Hospital Problem List: 1. non-STEMI .  No chest pain.  No wall motion abnormalities seen on echo. 2. long-standing insulin-dependent diabetes mellitus  3. essential hypertension by history.  BP soft at present. 4. Dementia, improved.  5. Acute on chronic diastolic heart failure, improving,  Plan:  Continue conservative medical management. Switch to medical dose Lovenox now. Add low dose lasix 20 mg daily. Could consider move to telemetry today. Increase activity.    LOS: 2 days    [name] 07/06/2012, 8:29 AM

## 2012-07-07 LAB — GLUCOSE, CAPILLARY
Glucose-Capillary: 192 mg/dL — ABNORMAL HIGH (ref 70–99)
Glucose-Capillary: 301 mg/dL — ABNORMAL HIGH (ref 70–99)

## 2012-07-07 LAB — BASIC METABOLIC PANEL
CO2: 21 mEq/L (ref 19–32)
Calcium: 8.9 mg/dL (ref 8.4–10.5)
Creatinine, Ser: 0.93 mg/dL (ref 0.50–1.10)
GFR calc non Af Amer: 53 mL/min — ABNORMAL LOW (ref >90)
Sodium: 125 mEq/L — ABNORMAL LOW (ref 135–145)

## 2012-07-07 LAB — CBC
MCV: 90.6 fL (ref 78.0–100.0)
Platelets: UNDETERMINED 10*3/uL (ref 150–400)
RBC: 4.15 MIL/uL (ref 3.87–5.11)
RDW: 12.9 % (ref 11.5–15.5)
WBC: 5.5 10*3/uL (ref 4.0–10.5)

## 2012-07-07 NOTE — Progress Notes (Signed)
Subjective:  Denies any chest pain or dyspnea.  Complains of constipation.  Objective:  Vital Signs in the last 24 hours: Temp:  [97.7 F (36.5 C)-98.2 F (36.8 C)] 98.2 F (36.8 C) (08/24 0454) Pulse Rate:  [29-67] 66  (08/24 0454) Resp:  [16-23] 16  (08/24 0454) BP: (92-116)/(43-73) 114/49 mmHg (08/24 0454) SpO2:  [94 %-100 %] 98 % (08/24 0454)  Intake/Output from previous day: 08/23 0701 - 08/24 0700 In: 120 [P.O.:120] Out: 400 [Urine:400] Intake/Output from this shift: Total I/O In: 120 [P.O.:120] Out: -      . aspirin EC  325 mg Oral Daily  . atorvastatin  40 mg Oral q1800  . enoxaparin (LOVENOX) injection  30 mg Subcutaneous Q24H  . furosemide  20 mg Oral Daily  . insulin aspart  0-15 Units Subcutaneous TID WC  . insulin aspart  0-5 Units Subcutaneous QHS  . insulin glargine  18 Units Subcutaneous QHS  . lisinopril  2.5 mg Oral Daily  . metoprolol tartrate  12.5 mg Oral BID  . polyethylene glycol  17 g Oral BID  . senna-docusate  1 tablet Oral BID  . DISCONTD: furosemide  40 mg Oral Once  . DISCONTD: insulin glargine  10 Units Subcutaneous QHS  . DISCONTD: insulin glargine  25 Units Subcutaneous BID  . DISCONTD: sodium chloride  3 mL Intravenous Q12H      . sodium chloride      Physical Exam: The patient appears to be in no distress. Oriented this am HEENT normal Neck supple Chest mild decreased BS bases Heart RRR The abdomen is soft and nontender.  Extremities reveal no edema. The right ankle is significantly deformed from prior surgery  Neurologic exam  Legally blind     Lab Results:  Basename 07/07/12 0535 07/05/12 0500  WBC 5.5 7.4  HGB 12.4 12.3  PLT PLATELET CLUMPS NOTED ON SMEAR, UNABLE TO ESTIMATE 161    Basename 07/07/12 0535 07/05/12 0500  NA 125* 130*  K 4.8 4.4  CL 93* 95*  CO2 21 27  GLUCOSE 211* 251*  BUN 28* 16  CREATININE 0.93 0.83    Basename 07/05/12 1011 07/04/12 2336  TROPONINI 4.24* 4.53*   Hepatic Function  Panel  Basename 07/05/12 0500  PROT 6.8  ALBUMIN 3.2*  AST 21  ALT 17  ALKPHOS 70  BILITOT 1.0  BILIDIR --  IBILI --     Cardiac Studies: [facility]* *[facility]* [address] [place], [place] [address] [phone]  ------------------------------------------------------------ Transthoracic Echocardiography  Patient: Julia, Oliver MR #: [id number] Study Date: 07/05/2012 Gender: F Age: 78 Height: 160cm Weight: 73kg BSA: 1.32m^2 Pt. Status: Room: 2927  PERFORMING [facility] ATTENDING Calvert Cantor SONOGRAPHER Emelia Loron, RDCS ORDERING Tresa Moore cc:  ------------------------------------------------------------ LV EF: 50% - 55%  ------------------------------------------------------------ Indications: MI - acute 410.91.  ------------------------------------------------------------ History: Risk factors: Acute coronary syndrome. Hypertension. Diabetes mellitus.  ------------------------------------------------------------ Study Conclusions  - Left ventricle: The cavity size was normal. There was mild focal basal hypertrophy of the septum. Systolic function was normal. The estimated ejection fraction was in the range of 50% to 55%. Regional wall motion abnormalities cannot be excluded. Doppler parameters are consistent with abnormal left ventricular relaxation (grade 1 diastolic dysfunction). Doppler parameters are consistent with high ventricular filling pressure. - Aortic valve: Mild regurgitation. - Mitral valve: Moderately calcified annulus. Mildly thickened leaflets . Mild regurgitation. - Left atrium: The atrium was mildly dilated. - Pulmonary arteries:  Systolic pressure was mildly increased.   Assessment/Plan:  Patient Active Hospital Problem List: 1. non-STEMI .  No chest pain.  No wall motion abnormalities seen on echo. Given age and overall medical status, plan  medical therapy; continue ASA, metoprolol and statin. 2. long-standing insulin-dependent diabetes mellitus  3. essential hypertension - controlled with present meds 4. Dementia, improved.  5. Acute on chronic diastolic heart failure- improved; DC lasix given hyponatremia; repeat BMET and BNP in AM.     LOS: 3 days    Julia Oliver 07/07/2012, 9:24 AM

## 2012-07-07 NOTE — Evaluation (Signed)
Physical Therapy Evaluation Patient Details Name: Julia Oliver MRN: [id number] DOB: [birth year] Today's Date: 07/07/2012 Time: 4098-1191 PT Time Calculation (min): 19 min  PT Assessment / Plan / Recommendation Clinical Impression  pt presents with NSTEMI and history of mobility deficits.  pt notes her family took her shoes and brace home.  Requested to have them brought back in for pt.  pt notes her husband is 76 y/o and he is her primary CG at home.  pt states he has to physically A her with all aspects of her mobility and self-care.  pt notes she has fallen before and he fell with her.  ? safety for pt and her spouse at home.  Unclear if other family memebers can begin to provide A instead of 76 y/o husband or if pt needs to consider SNF.      PT Assessment  Patient needs continued PT services    Follow Up Recommendations  Skilled nursing facility (If home needs HHPT, 24hr A, HHAide.  )    Barriers to Discharge Decreased caregiver support pt's primary CG is her 62 y/o husband.  ?safety at home.      Equipment Recommendations   (Ramp for home if goes home)    Recommendations for Other Services OT consult   Frequency Min 3X/week    Precautions / Restrictions Precautions Precautions: Fall Restrictions Weight Bearing Restrictions: No   Pertinent Vitals/Pain Denies pain.        Mobility  Bed Mobility Bed Mobility: Supine to Sit;Sitting - Scoot to Edge of Bed Supine to Sit: 4: Min assist;With rails Sitting - Scoot to [employer] of Bed: 5: Supervision Details for Bed Mobility Assistance: pt uses PT to pull on to bring trunk to sitting.  pt notes this is what she does at home with her husband.   Transfers Transfers: Sit to Stand;Stand to [employer] Transfers Sit to Stand: 4: Min assist;With upper extremity assist;With armrests;From bed Stand to Sit: 4: Min assist;With upper extremity assist;With armrests;To chair/3-in-1 Stand Pivot Transfers: 4: Min assist;With  armrests Details for Transfer Assistance: pt directs PT where to place recliner so that it is positioned how her W/C would be at home and was able to perform SPT with MinA and avoided WBing through R LE.   Ambulation/Gait Ambulation/Gait Assistance: Not tested (comment) Stairs: No Wheelchair Mobility Wheelchair Mobility: No    Exercises     PT Diagnosis: Difficulty walking;Generalized weakness  PT Problem List: Decreased strength;Decreased activity tolerance;Decreased balance;Decreased mobility;Decreased cognition;Decreased knowledge of use of DME PT Treatment Interventions: DME instruction;Gait training;Functional mobility training;Therapeutic activities;Therapeutic exercise;Balance training;Stair training;Patient/family education   PT Goals Acute Rehab PT Goals PT Goal Formulation: With patient Time For Goal Achievement: 07/21/12 Potential to Achieve Goals: Good Pt will go Sit to Stand: with modified independence PT Goal: Sit to Stand - Progress: Goal set today Pt will go Stand to Sit: with modified independence PT Goal: Stand to Sit - Progress: Goal set today Pt will Transfer Bed to Chair/Chair to Bed: with modified independence PT Transfer Goal: Bed to Chair/Chair to Bed - Progress: Goal set today Pt will Ambulate: 16 - 50 feet;with supervision;with rolling walker PT Goal: Ambulate - Progress: Goal set today Pt will Go Up / Down Stairs: 3-5 stairs;with min assist;with rail(s) PT Goal: Up/Down Stairs - Progress: Goal set today  Visit Information  Last PT Received On: 07/07/12 Assistance Needed: +2    Subjective Data  Subjective: I have special shoes that I wear for my R foot.  Patient Stated Goal: Home   Prior Functioning  Home Living Lives With: Spouse Available Help at Discharge: Family;Available 24 hours/day Type of Home: House Home Access: Stairs to enter [employer] of Steps: 4 Entrance Stairs-Rails: Right;Left;Can reach both Home Layout: One  level Bathroom Shower/Tub:  (Doesn't use) Bathroom Toilet: Handicapped height Bathroom Accessibility: Yes How Accessible: Accessible via walker Home Adaptive Equipment: Bedside commode/3-in-1;Grab bars around toilet;Walker - rolling;Wheelchair - manual Prior Function Level of Independence: Needs assistance Needs Assistance: Bathing;Dressing;Toileting;Meal Prep;Light Housekeeping;Gait;Transfers Bath: Moderate Dressing: Moderate Toileting: Moderate Meal Prep: Total Light Housekeeping: Total Gait Assistance: pt only amb very short distances from bathroom door to toilet and down the steps.  pt uses a W/C primarily.   Transfer Assistance: Husband A with transfers.   Able to Take Stairs?: Yes (with A) Driving: No Vocation: Retired Comments: pt indicates 10 y/o husband helps with all aspects of care.   Communication Communication: No difficulties    Cognition  Overall Cognitive Status: History of cognitive impairments - at baseline Arousal/Alertness: Awake/alert Orientation Level: Disoriented to;Time Behavior During Session: WFL for tasks performed Cognition - Other Comments: pt with mild memory deficits, but per history this seems to be baseline.      Extremity/Trunk Assessment Right Lower Extremity Assessment RLE ROM/Strength/Tone: Deficits RLE ROM/Strength/Tone Deficits: pt with history multiple fxs and now ankle/foot with rigid supination deformity and decreased ROM in knee.  Grossly weak, but not formally assessed.   RLE Sensation: WFL - Light Touch Left Lower Extremity Assessment LLE ROM/Strength/Tone: Deficits LLE ROM/Strength/Tone Deficits: AROM WFL, Strength grossly 4-/5 LLE Sensation: WFL - Light Touch Trunk Assessment Trunk Assessment: Kyphotic   Balance Balance Balance Assessed: No  End of Session PT - End of Session Equipment Utilized During Treatment:  (None) Activity Tolerance: Patient tolerated treatment well Patient left: in chair;with call bell/phone within  reach Nurse Communication: Mobility status  GP     Sunny Schlein, [place] [phone] 07/07/2012, 12:01 PM

## 2012-07-07 NOTE — Progress Notes (Signed)
[facility] Progress Note [place] TEAM 1 - Stepdown/ICU TEAM   Day Greb ZOX:[id number] DOB: 1920-05-18 DOA: 07/04/2012 PCP: No primary provider on file.  Brief narrative: 78 year old female with past medical history of hypertension, legally blind, diabetes last hemoglobin A1c of 8.5, a baseline creatinine of 0.7 who was transferred here from [place] for a possible non-ST elevation MI. The patient also has dementia and cannot give a history as per her son this all started about a week ago she complained of chest pain and shortness of breath that lasted about 8 hours, resolved after she had a bowel movement. She woke up but was incoherent and was not able to get out of bed as she felt so weak.   Assessment/Plan:  Acute coronary syndrome  NSTEMI - conservative management for now - cardiology is following - no wall motion abnormalities appreciated on echocardiogram  HTN (hypertension)  Cont current treatment and follow - well-controlled at this  Diabetes mellitus type II  Poorly controlled but does appear to be improving -  A1c noted to be elevated at 8.9  Hyponatremia  Suspected due to fluid overload - holding lasix. And repeat bmp in am.   Dementia v/s acute Delirium  Per son she is very oriented and without memory difficulties at home - urinalysis slightly abnormal and cultures pending. -  check B12 and folic acid levels  Acute on chronic diastolic heart failure Diuresis being directed by cardiology   Code Status: Unclear Family Communication: Patient lives with her son Disposition Plan: Stable for transfer to telemetry bed  Consultants:  cardiology  Procedures: None  Antibiotics: None  DVT prophylaxis: Lovenox  HPI/Subjective:  She denies shortness of breath or chest pain.  Her primary complaint is that of constipation and she is quite fixated on obtaining relief.   Objective: Blood pressure 136/84, pulse 67, temperature 98.2 F  (36.8 C), temperature source Oral, resp. rate 18, height 5\' 3"  (1.6 m), weight 73.4 kg (161 lb 13.1 oz), SpO2 97.00%.  Intake/Output Summary (Last 24 hours) at 07/07/12 1844 Last data filed at 07/07/12 1802  Gross per 24 hour  Intake    240 ml  Output      0 ml  Net    240 ml     Exam: General: No acute respiratory distress at rest Lungs: Mild bibasilar crackles with good air movement throughout other fields and no wheeze Cardiovascular: Regular rate and rhythm without murmur gallop or rub Abdomen: Nontender, nondistended, soft, bowel sounds positive, no rebound, no ascites, no appreciable mass Extremities: Trace bilateral lower extremity edema without cyanosis or clubbing  Data Reviewed: Basic Metabolic Panel:  Lab 07/07/12 8119 07/05/12 0500 07/04/12 1608  NA 125* 130* 128*  K 4.8 4.4 4.0  CL 93* 95* 94*  CO2 21 27 25   GLUCOSE 211* 251* 186*  BUN 28* 16 19  CREATININE 0.93 0.83 0.82  CALCIUM 8.9 9.1 9.1  MG -- -- --  PHOS -- -- --   Liver Function Tests:  Lab 07/05/12 0500 07/04/12 1608  AST 21 29  ALT 17 19  ALKPHOS 70 71  BILITOT 1.0 0.8  PROT 6.8 6.9  ALBUMIN 3.2* 3.2*   CBC:  Lab 07/07/12 0535 07/05/12 0500 07/04/12 1608  WBC 5.5 7.4 6.7  NEUTROABS -- -- 4.9  HGB 12.4 12.3 12.0  HCT 37.6 35.4* 35.2*  MCV 90.6 87.8 86.7  PLT PLATELET CLUMPS NOTED ON SMEAR, UNABLE TO ESTIMATE 161 151   Cardiac Enzymes:  Lab  07/05/12 1011 07/04/12 2336 07/04/12 1607  CKTOTAL 103 134 149  CKMB 4.3* 5.4* 5.9*  CKMBINDEX -- -- --  TROPONINI 4.24* 4.53* 6.72*   BNP (last 3 results)  Basename 07/04/12 1605  PROBNP 4677.0*   CBG:  Lab 07/07/12 1618 07/07/12 1103 07/07/12 0609 07/06/12 2123 07/06/12 1627  GLUCAP 301* 192* 188* 185* 156*    Recent Results (from the past 240 hour(s))  MRSA PCR SCREENING     Status: Normal   Collection Time   07/04/12  3:27 PM      Component Value Range Status Comment   MRSA by PCR NEGATIVE  NEGATIVE Final       Studies:  Recent x-ray studies have been reviewed in detail by the Attending Physician  Scheduled Meds:  Reviewed in detail by the Attending Physician   Kathlen Mody , MD [facility] Office  [phone] Pager [phone]  On-Call/Text Page:      [email or web address]      password [email or web address]  If 7PM-7AM, please contact night-coverage [email or web address] Password [facility] 07/07/2012, 6:44 PM   LOS: 3 days

## 2012-07-08 LAB — GLUCOSE, CAPILLARY
Glucose-Capillary: 129 mg/dL — ABNORMAL HIGH (ref 70–99)
Glucose-Capillary: 205 mg/dL — ABNORMAL HIGH (ref 70–99)
Glucose-Capillary: 174 mg/dL — ABNORMAL HIGH (ref 70–99)
Glucose-Capillary: 221 mg/dL — ABNORMAL HIGH (ref 70–99)

## 2012-07-08 LAB — BASIC METABOLIC PANEL
BUN: 23 mg/dL (ref 6–23)
CO2: 25 mEq/L (ref 19–32)
Calcium: 9 mg/dL (ref 8.4–10.5)
Chloride: 90 mEq/L — ABNORMAL LOW (ref 96–112)
Creatinine, Ser: 0.87 mg/dL (ref 0.50–1.10)
GFR calc Af Amer: 66 mL/min — ABNORMAL LOW (ref >90)

## 2012-07-08 NOTE — Progress Notes (Signed)
Subjective:  Denies any chest pain or dyspnea.    Objective:  Vital Signs in the last 24 hours: Temp:  [97.7 F (36.5 C)-98.6 F (37 C)] 97.7 F (36.5 C) (08/25 0438) Pulse Rate:  [66-70] 66  (08/25 0438) Resp:  [18] 18  (08/25 0438) BP: (132-143)/(74-84) 132/75 mmHg (08/25 0438) SpO2:  [97 %-99 %] 97 % (08/25 0438)  Intake/Output from previous day: 08/24 0701 - 08/25 0700 In: 240 [P.O.:240] Out: -  Intake/Output from this shift: Total I/O In: 120 [P.O.:120] Out: -      . aspirin EC  325 mg Oral Daily  . atorvastatin  40 mg Oral q1800  . enoxaparin (LOVENOX) injection  30 mg Subcutaneous Q24H  . insulin aspart  0-15 Units Subcutaneous TID WC  . insulin aspart  0-5 Units Subcutaneous QHS  . insulin glargine  18 Units Subcutaneous QHS  . lisinopril  2.5 mg Oral Daily  . metoprolol tartrate  12.5 mg Oral BID  . polyethylene glycol  17 g Oral BID  . senna-docusate  1 tablet Oral BID  . DISCONTD: furosemide  20 mg Oral Daily      . sodium chloride      Physical Exam: The patient appears to be in no distress. Oriented this am HEENT normal Neck supple Chest mild decreased BS bases Heart RRR The abdomen is soft and nontender.  Extremities reveal no edema. The right ankle is significantly deformed from prior surgery  Neurologic exam  Legally blind     Lab Results:  [facility] 07/07/12 0535  WBC 5.5  HGB 12.4  PLT PLATELET CLUMPS NOTED ON SMEAR, UNABLE TO ESTIMATE    Basename 07/08/12 0500 07/07/12 0535  NA 125* 125*  K 4.3 4.8  CL 90* 93*  CO2 25 21  GLUCOSE 143* 211*  BUN 23 28*  CREATININE 0.87 0.93    Basename 07/05/12 1011  TROPONINI 4.24*     Cardiac Studies: [facility]* *[facility]* [address] [place], [place] [address] [phone]  ------------------------------------------------------------ Transthoracic Echocardiography  Patient: Julia Oliver, Deeg MR #: [id number] Study Date:  07/05/2012 Gender: F Age: 6 Height: 160cm Weight: 73kg BSA: 1.58m^2 Pt. Status: Room: 2927  PERFORMING [facility] ATTENDING Calvert Cantor SONOGRAPHER Emelia Loron, RDCS ORDERING Tresa Moore cc:  ------------------------------------------------------------ LV EF: 50% - 55%  ------------------------------------------------------------ Indications: MI - acute 410.91.  ------------------------------------------------------------ History: Risk factors: Acute coronary syndrome. Hypertension. Diabetes mellitus.  ------------------------------------------------------------ Study Conclusions  - Left ventricle: The cavity size was normal. There was mild focal basal hypertrophy of the septum. Systolic function was normal. The estimated ejection fraction was in the range of 50% to 55%. Regional wall motion abnormalities cannot be excluded. Doppler parameters are consistent with abnormal left ventricular relaxation (grade 1 diastolic dysfunction). Doppler parameters are consistent with high ventricular filling pressure. - Aortic valve: Mild regurgitation. - Mitral valve: Moderately calcified annulus. Mildly thickened leaflets . Mild regurgitation. - Left atrium: The atrium was mildly dilated. - Pulmonary arteries: Systolic pressure was mildly increased.   Assessment/Plan:  Patient Active Hospital Problem List: 1. non-STEMI .  No chest pain.  No wall motion abnormalities seen on echo. Given age and overall medical status, plan medical therapy; continue ASA, metoprolol and statin. 2. long-standing insulin-dependent diabetes mellitus  3. essential hypertension - controlled with present meds 4. Dementia, improved.  5. Acute on chronic diastolic heart failure- BNP mildly elevated but euvolemic on exam; continue to hold lasix given hyponatremia.  LOS: 4 days    Olga Millers 07/08/2012, 8:34 AM

## 2012-07-08 NOTE — Plan of Care (Signed)
Problem: Phase III Progression Outcomes Goal: Discharge plan remains appropriate-arrangements made Outcome: Progressing Will benefit from a SNF and rehab program due to immobility.  Problem: Consults Goal: Chest Pain Patient Education (See Patient Education module for education specifics.)  Outcome: Progressing Denies current chest pain. Will continue to monitor Goal: Skin Care Protocol Initiated - if indicated If consults are not indicated, leave blank or document N/A  Outcome: Completed/Met Date Met:  07/08/12 Repositioning every 2 hrs and use of barrier cream after bed pan use. Goal: Diabetes Guidelines if Diabetic/Glucose > 140 If diabetic or lab glucose is > 140 mg/dl - Initiate Diabetes/Hyperglycemia Guidelines & Document Interventions  Outcome: Not Progressing CBG continues to be elevated. Will cover and leave a note for MD to increase long acting(Lantus).

## 2012-07-08 NOTE — Plan of Care (Signed)
Problem: Phase III Progression Outcomes Goal: Discharge plan remains appropriate-arrangements made Outcome: Progressing Pt will benefit from SNF.  Problem: Phase III Progression Outcomes Goal: Hemodynamically stable Outcome: Progressing Vital signs stable. Will continue to monitor. Goal: No anginal pain Outcome: Completed/Met Date Met:  07/08/12 Pain well controled with meds. Pain at patients tolerable level. Will continue to monitor and intervene if necessary.    Goal: Tolerating diet Outcome: Not Progressing Complaining of abdominal pain. Laxatives held due to loose stools.

## 2012-07-08 NOTE — Progress Notes (Signed)
[facility] Progress Note    Julia Oliver GNF:[id number] DOB: 01/11/1920 DOA: 07/04/2012 PCP: No primary provider on file.  Brief narrative: 78 year old female with past medical history of hypertension, legally blind, diabetes last hemoglobin A1c of 8.5, a baseline creatinine of 0.7 who was transferred here from [facility] for a possible non-ST elevation MI. The patient also has dementia and cannot give a history as per her son this all started about a week ago she complained of chest pain and shortness of breath that lasted about 8 hours, resolved after she had a bowel movement. She woke up but was incoherent and was not able to get out of bed as she felt so weak.   Assessment/Plan:  Acute coronary syndrome  NSTEMI - conservative management for now - cardiology is following - no wall motion abnormalities appreciated on echocardiogram  HTN (hypertension)  Cont current treatment and follow - well-controlled at this  Diabetes mellitus type II  Poorly controlled but does appear to be improving -  A1c noted to be elevated at 8.9  Hyponatremia  Suspected due to fluid overload - holding lasix.if persistenly high will evlaute for SIADH. And repeat bmp in am.   Dementia v/s acute Delirium  Per son she is very oriented and without memory difficulties at home - urinalysis slightly abnormal and cultures pending. -  check B12 and folic acid levels  Acute on chronic diastolic heart failure Diuresis being directed by cardiology   Code Status: Unclear Family Communication: Patient lives with her son Disposition Plan: Stable for transfer to telemetry bed  Consultants: [place] cardiology  Procedures: None  Antibiotics: None  DVT prophylaxis: Lovenox  HPI/Subjective:  She denies shortness of breath or chest pain.  Her primary complaint is that of constipation and she is quite fixated on obtaining relief.   Objective: Blood pressure 106/54, pulse 54, temperature  97.3 F (36.3 C), temperature source Oral, resp. rate 18, height 5\' 3"  (1.6 m), weight 73.4 kg (161 lb 13.1 oz), SpO2 99.00%.  Intake/Output Summary (Last 24 hours) at 07/08/12 1957 Last data filed at 07/08/12 1822  Gross per 24 hour  Intake    420 ml  Output      0 ml  Net    420 ml     Exam: General: No acute respiratory distress at rest Lungs: Mild bibasilar crackles with good air movement throughout other fields and no wheeze Cardiovascular: Regular rate and rhythm without murmur gallop or rub Abdomen: Nontender, nondistended, soft, bowel sounds positive, no rebound, no ascites, no appreciable mass Extremities: Trace bilateral lower extremity edema without cyanosis or clubbing  Data Reviewed: Basic Metabolic Panel:  Lab 07/08/12 8469 07/07/12 0535 07/05/12 0500 07/04/12 1608  NA 125* 125* 130* 128*  K 4.3 4.8 4.4 4.0  CL 90* 93* 95* 94*  CO2 25 21 27 25   GLUCOSE 143* 211* 251* 186*  BUN 23 28* 16 19  CREATININE 0.87 0.93 0.83 0.82  CALCIUM 9.0 8.9 9.1 9.1  MG -- -- -- --  PHOS -- -- -- --   Liver Function Tests:  Lab 07/05/12 0500 07/04/12 1608  AST 21 29  ALT 17 19  ALKPHOS 70 71  BILITOT 1.0 0.8  PROT 6.8 6.9  ALBUMIN 3.2* 3.2*   CBC:  Lab 07/07/12 0535 07/05/12 0500 07/04/12 1608  WBC 5.5 7.4 6.7  NEUTROABS -- -- 4.9  HGB 12.4 12.3 12.0  HCT 37.6 35.4* 35.2*  MCV 90.6 87.8 86.7  PLT PLATELET CLUMPS NOTED ON  SMEAR, UNABLE TO ESTIMATE 161 151   Cardiac Enzymes:  Lab 07/05/12 1011 07/04/12 2336 07/04/12 1607  CKTOTAL 103 134 149  CKMB 4.3* 5.4* 5.9*  CKMBINDEX -- -- --  TROPONINI 4.24* 4.53* 6.72*   BNP (last 3 results)  Basename 07/08/12 0500 07/04/12 1605  PROBNP 2689.0* 4677.0*   CBG:  Lab 07/08/12 1558 07/08/12 1127 07/08/12 0616 07/07/12 2136 07/07/12 1618  GLUCAP 174* 205* 129* 187* 301*    Recent Results (from the past 240 hour(s))  MRSA PCR SCREENING     Status: Normal   Collection Time   07/04/12  3:27 PM      Component Value  Range Status Comment   MRSA by PCR NEGATIVE  NEGATIVE Final   URINE CULTURE     Status: Normal (Preliminary result)   Collection Time   07/06/12  2:16 PM      Component Value Range Status Comment   Specimen Description URINE, CLEAN CATCH   Final    Special Requests NONE   Final    Culture  Setup Time 07/06/2012 20:53   Final    Colony Count >=100,000 COLONIES/ML   Final    Culture     Final    Value: STAPHYLOCOCCUS SPECIES (COAGULASE NEGATIVE)     Note: RIFAMPIN AND GENTAMICIN SHOULD NOT BE USED AS SINGLE DRUGS FOR TREATMENT OF STAPH INFECTIONS.   Report Status PENDING   Incomplete      Studies:  Recent x-ray studies have been reviewed in detail by the Attending Physician  Scheduled Meds:  Reviewed in detail by the Attending Physician   Kathlen Mody , MD [facility] Office  [phone] Pager [phone]  On-Call/Text Page:      [email or web address]      password [email or web address]  If 7PM-7AM, please contact night-coverage [email or web address] Password [facility] 07/08/2012, 7:57 PM   LOS: 4 days

## 2012-07-09 LAB — BASIC METABOLIC PANEL
BUN: 23 mg/dL (ref 6–23)
CO2: 25 mEq/L (ref 19–32)
Calcium: 8.8 mg/dL (ref 8.4–10.5)
Chloride: 93 mEq/L — ABNORMAL LOW (ref 96–112)
Creatinine, Ser: 0.87 mg/dL (ref 0.50–1.10)

## 2012-07-09 LAB — GLUCOSE, CAPILLARY
Glucose-Capillary: 125 mg/dL — ABNORMAL HIGH (ref 70–99)
Glucose-Capillary: 140 mg/dL — ABNORMAL HIGH (ref 70–99)

## 2012-07-09 MED ORDER — BISACODYL 5 MG PO TBEC: 5.0000 mg | DELAYED_RELEASE_TABLET | Freq: Every day | ORAL | Status: DC | PRN

## 2012-07-09 MED ORDER — ENOXAPARIN SODIUM 40 MG/0.4ML ~~LOC~~ SOLN
40.0000 mg | SUBCUTANEOUS | Status: DC
  Administered 2012-07-10: 40 mg via SUBCUTANEOUS
  Filled 2012-07-09: qty 0.4

## 2012-07-09 NOTE — Progress Notes (Signed)
OT Cancellation Note  Treatment cancelled today due to pt requested to defer OT eval to SNF,  Pt wants to go to [facility].  Alba Cory 07/09/2012, 12:28 PM

## 2012-07-09 NOTE — Progress Notes (Signed)
Subjective:  Denies any chest pain or dyspnea.  Rhythm stable NSR  Objective:  Vital Signs in the last 24 hours: Temp:  [97.3 F (36.3 C)-98.5 F (36.9 C)] 97.6 F (36.4 C) (08/26 0818) Pulse Rate:  [54-71] 71  (08/26 0818) Resp:  [18-22] 22  (08/26 0818) BP: (106-151)/(54-79) 114/69 mmHg (08/26 0818) SpO2:  [96 %-99 %] 97 % (08/26 0818) Weight:  [166 lb 3.6 oz (75.4 kg)] 166 lb 3.6 oz (75.4 kg) (08/26 0818)  Intake/Output from previous day: 08/25 0701 - 08/26 0700 In: 420 [P.O.:420] Out: -  Intake/Output from this shift: Total I/O In: 375 [P.O.:375] Out: -      . aspirin EC  325 mg Oral Daily  . atorvastatin  40 mg Oral q1800  . enoxaparin (LOVENOX) injection  40 mg Subcutaneous Q24H  . insulin aspart  0-15 Units Subcutaneous TID WC  . insulin aspart  0-5 Units Subcutaneous QHS  . insulin glargine  18 Units Subcutaneous QHS  . lisinopril  2.5 mg Oral Daily  . metoprolol tartrate  12.5 mg Oral BID  . polyethylene glycol  17 g Oral BID  . senna-docusate  1 tablet Oral BID  . DISCONTD: enoxaparin (LOVENOX) injection  30 mg Subcutaneous Q24H      Physical Exam: The patient appears to be in no distress. Oriented this am HEENT normal Neck supple Chest mild decreased BS bases.  Few fine rales bilat. Heart RRR The abdomen is soft and nontender. Bowel sounds are present. Extremities reveal no edema. The right ankle is significantly deformed from prior surgery  Neurologic exam  Legally blind     Lab Results:  [facility] 07/07/12 0535  WBC 5.5  HGB 12.4  PLT PLATELET CLUMPS NOTED ON SMEAR, UNABLE TO ESTIMATE    Basename 07/08/12 0500 07/07/12 0535  NA 125* 125*  K 4.3 4.8  CL 90* 93*  CO2 25 21  GLUCOSE 143* 211*  BUN 23 28*  CREATININE 0.87 0.93   No results found for this basename: TROPONINI:2,CK,MB:2 in the last 72 hours   Cardiac Studies: [facility]* *[facility]* [address] [place], [place]  [address] [phone]  ------------------------------------------------------------ Transthoracic Echocardiography  Patient: Julia Oliver, Julia Oliver MR #: [id number] Study Date: 07/05/2012 Gender: F Age: 78 Height: 160cm Weight: 73kg BSA: 1.43m^2 Pt. Status: Room: 2927  PERFORMING [facility] ATTENDING Calvert Cantor SONOGRAPHER Emelia Loron, RDCS ORDERING Tresa Moore cc:  ------------------------------------------------------------ LV EF: 50% - 55%  ------------------------------------------------------------ Indications: MI - acute 410.91.  ------------------------------------------------------------ History: Risk factors: Acute coronary syndrome. Hypertension. Diabetes mellitus.  ------------------------------------------------------------ Study Conclusions  - Left ventricle: The cavity size was normal. There was mild focal basal hypertrophy of the septum. Systolic function was normal. The estimated ejection fraction was in the range of 50% to 55%. Regional wall motion abnormalities cannot be excluded. Doppler parameters are consistent with abnormal left ventricular relaxation (grade 1 diastolic dysfunction). Doppler parameters are consistent with high ventricular filling pressure. - Aortic valve: Mild regurgitation. - Mitral valve: Moderately calcified annulus. Mildly thickened leaflets . Mild regurgitation. - Left atrium: The atrium was mildly dilated. - Pulmonary arteries: Systolic pressure was mildly increased.   Assessment/Plan:  Patient Active Hospital Problem List: 1. non-STEMI .  No chest pain.  No wall motion abnormalities seen on echo. Given age and overall medical status, plan medical therapy; continue ASA, metoprolol and statin. 2. long-standing insulin-dependent diabetes mellitus  3. essential hypertension - controlled with present meds 4.  Dementia, improved.  5. Acute on chronic diastolic heart failure- BNP mildly  elevated but euvolemic on exam; continue to hold lasix given hyponatremia.  Expect she could be discharged soon.   LOS: 5 days    Julia Oliver 07/09/2012, 8:52 AM

## 2012-07-09 NOTE — Progress Notes (Signed)
CSW called pt daughter Clydie Braun. Clydie Braun stated that pt and self would like to be admitted to [facility] in [place].  CSW has contacted SNF and relayed interest.  CSW will f/u with SNF in the am and report back to pt and family. Vickii Penna, LCSWA [phone]  Clinical Social Work

## 2012-07-09 NOTE — Progress Notes (Signed)
[facility] Progress Note    Julia Oliver VHQ:[id number] DOB: 05-Oct-1920 DOA: 07/04/2012 PCP: No primary provider on file.  Brief narrative: 78 year old female with past medical history of hypertension, legally blind, diabetes last hemoglobin A1c of 8.5, a baseline creatinine of 0.7 who was transferred here from [facility] for a possible non-ST elevation MI. The patient also has dementia and cannot give a history as per her son this all started about a week ago she complained of chest pain and shortness of breath that lasted about 8 hours, resolved after she had a bowel movement. She woke up but was incoherent and was not able to get out of bed as she felt so weak.   Assessment/Plan:  Acute coronary syndrome  NSTEMI - conservative management for now - cardiology is following - no wall motion abnormalities appreciated on echocardiogram  HTN (hypertension)  Cont current treatment and follow - well-controlled at this  Diabetes mellitus type II  Poorly controlled but does appear to be improving -  A1c noted to be elevated at 8.9 CBG (last 3)   Basename 07/09/12 1643 07/09/12 1515 07/09/12 1115  GLUCAP 125* 130* 121*     Hyponatremia  Suspected due to fluid overload - holding lasix.if persistenly high will evlaute for SIADH. And repeat bmp in am. Currently asymptomatic.   Dementia v/s acute Delirium  Per son she is very oriented and without memory difficulties at home - urinalysis slightly abnormal and cultures show coag neg staph, with sensitivities pending.  check B12 and folic acid levels pending.   Acute on chronic diastolic heart failure Appears to be compensated.   Code Status: Unclear Family Communication: Patient lives with her son Disposition Plan: SNF tomorrow.   Consultants: [facility] cardiology  Procedures: None  Antibiotics: None  DVT prophylaxis: Lovenox  HPI/Subjective:  She denies shortness of breath or chest pain.    Objective: Blood pressure 108/60, pulse 62, temperature 97.6 F (36.4 C), temperature source Oral, resp. rate 18, height 5\' 3"  (1.6 m), weight 75.4 kg (166 lb 3.6 oz), SpO2 95.00%.  Intake/Output Summary (Last 24 hours) at 07/09/12 1855 Last data filed at 07/09/12 1300  Gross per 24 hour  Intake    735 ml  Output      0 ml  Net    735 ml     Exam: General: No acute respiratory distress at rest Lungs: Mild bibasilar crackles with good air movement throughout other fields and no wheeze Cardiovascular: Regular rate and rhythm without murmur gallop or rub Abdomen: Nontender, nondistended, soft, bowel sounds positive, no rebound, no ascites, no appreciable mass Extremities: Trace bilateral lower extremity edema without cyanosis or clubbing  Data Reviewed: Basic Metabolic Panel:  Lab 07/09/12 4132 07/08/12 0500 07/07/12 0535 07/05/12 0500 07/04/12 1608  NA 127* 125* 125* 130* 128*  K 4.3 4.3 4.8 4.4 4.0  CL 93* 90* 93* 95* 94*  CO2 25 25 21 27 25   GLUCOSE 130* 143* 211* 251* 186*  BUN 23 23 28* 16 19  CREATININE 0.87 0.87 0.93 0.83 0.82  CALCIUM 8.8 9.0 8.9 9.1 9.1  MG -- -- -- -- --  PHOS -- -- -- -- --   Liver Function Tests:  Lab 07/05/12 0500 07/04/12 1608  AST 21 29  ALT 17 19  ALKPHOS 70 71  BILITOT 1.0 0.8  PROT 6.8 6.9  ALBUMIN 3.2* 3.2*   CBC:  Lab 07/07/12 0535 07/05/12 0500 07/04/12 1608  WBC 5.5 7.4 6.7  NEUTROABS -- -- 4.9  HGB 12.4 12.3 12.0  HCT 37.6 35.4* 35.2*  MCV 90.6 87.8 86.7  PLT PLATELET CLUMPS NOTED ON SMEAR, UNABLE TO ESTIMATE 161 151   Cardiac Enzymes:  Lab 07/05/12 1011 07/04/12 2336 07/04/12 1607  CKTOTAL 103 134 149  CKMB 4.3* 5.4* 5.9*  CKMBINDEX -- -- --  TROPONINI 4.24* 4.53* 6.72*   BNP (last 3 results)  Basename 07/08/12 0500 07/04/12 1605  PROBNP 2689.0* 4677.0*   CBG:  Lab 07/09/12 1643 07/09/12 1515 07/09/12 1115 07/09/12 0620 07/08/12 2054  GLUCAP 125* 130* 121* 97 221*    Recent Results (from the past 240  hour(s))  MRSA PCR SCREENING     Status: Normal   Collection Time   07/04/12  3:27 PM      Component Value Range Status Comment   MRSA by PCR NEGATIVE  NEGATIVE Final   URINE CULTURE     Status: Normal (Preliminary result)   Collection Time   07/06/12  2:16 PM      Component Value Range Status Comment   Specimen Description URINE, CLEAN CATCH   Final    Special Requests NONE   Final    Culture  Setup Time 07/06/2012 20:53   Final    Colony Count >=100,000 COLONIES/ML   Final    Culture     Final    Value: STAPHYLOCOCCUS SPECIES (COAGULASE NEGATIVE)     Note: RIFAMPIN AND GENTAMICIN SHOULD NOT BE USED AS SINGLE DRUGS FOR TREATMENT OF STAPH INFECTIONS.   Report Status PENDING   Incomplete      Studies:  Recent x-ray studies have been reviewed in detail by the Attending Physician  Scheduled Meds:  Reviewed in detail by the Attending Physician   Kathlen Mody , MD [facility] Office  [phone] Pager [phone]  On-Call/Text Page:      [email or web address]      password [email or web address]  If 7PM-7AM, please contact night-coverage [email or web address] Password [email or web address] 07/09/2012, 6:55 PM   LOS: 5 days

## 2012-07-09 NOTE — Progress Notes (Signed)
Patient was complaining of abdomen cramping, while turning patient noticed loose stool followed by hard stool. Manually inserted lubricated index finger to assist with removal of stool. There was no  bleeding following disimpaction.

## 2012-07-10 LAB — BASIC METABOLIC PANEL
Chloride: 94 mEq/L — ABNORMAL LOW (ref 96–112)
GFR calc Af Amer: 53 mL/min — ABNORMAL LOW (ref >90)
GFR calc non Af Amer: 45 mL/min — ABNORMAL LOW (ref >90)
Potassium: 4.3 mEq/L (ref 3.5–5.1)
Sodium: 130 mEq/L — ABNORMAL LOW (ref 135–145)

## 2012-07-10 LAB — VITAMIN B12: Vitamin B-12: 899 pg/mL (ref 211–911)

## 2012-07-10 LAB — URINE CULTURE: Colony Count: >=100000

## 2012-07-10 LAB — FOLATE: Folate: >20 ng/mL

## 2012-07-10 MED ORDER — LISINOPRIL 2.5 MG PO TABS: 2.5000 mg | ORAL_TABLET | Freq: Every day | ORAL | Status: DC

## 2012-07-10 MED ORDER — POLYETHYLENE GLYCOL 3350 17 G PO PACK
17.0000 g | PACK | Freq: Two times a day (BID) | ORAL | Status: AC
Start: 2012-07-10 — End: 2012-07-13

## 2012-07-10 MED ORDER — ATORVASTATIN CALCIUM 40 MG PO TABS: 40.0000 mg | ORAL_TABLET | Freq: Every day | ORAL | Status: DC

## 2012-07-10 MED ORDER — SENNOSIDES-DOCUSATE SODIUM 8.6-50 MG PO TABS: 1.0000 | ORAL_TABLET | Freq: Two times a day (BID) | ORAL | Status: AC | PRN

## 2012-07-10 MED ORDER — METOPROLOL TARTRATE 12.5 MG HALF TABLET: 12.5000 mg | ORAL_TABLET | Freq: Two times a day (BID) | ORAL | Status: DC

## 2012-07-10 MED ORDER — DOXYCYCLINE HYCLATE 100 MG PO TABS: 100.0000 mg | ORAL_TABLET | Freq: Two times a day (BID) | ORAL | Status: AC

## 2012-07-10 MED ORDER — ASPIRIN EC 81 MG PO TBEC
81.0000 mg | DELAYED_RELEASE_TABLET | Freq: Every day | ORAL | Status: DC
  Administered 2012-07-10: 81 mg via ORAL
  Filled 2012-07-10: qty 1

## 2012-07-10 NOTE — Progress Notes (Signed)
Subjective:  Denies any chest pain or dyspnea.  Rhythm stable NSR. Serum sodium improving.  Objective:  Vital Signs in the last 24 hours: Temp:  [97.6 F (36.4 C)-98.2 F (36.8 C)] 97.7 F (36.5 C) (08/27 0537) Pulse Rate:  [62-71] 62  (08/27 0537) Resp:  [18-22] 18  (08/27 0537) BP: (108-130)/(60-80) 120/71 mmHg (08/27 0537) SpO2:  [95 %-98 %] 96 % (08/27 0537) Weight:  [164 lb 6.4 oz (74.571 kg)-166 lb 3.6 oz (75.4 kg)] 164 lb 6.4 oz (74.571 kg) (08/27 0537)  Intake/Output from previous day: 08/26 0701 - 08/27 0700 In: 975 [P.O.:975] Out: 600 [Urine:600] Intake/Output from this shift:       . aspirin EC  325 mg Oral Daily  . atorvastatin  40 mg Oral q1800  . enoxaparin (LOVENOX) injection  40 mg Subcutaneous Q24H  . insulin aspart  0-15 Units Subcutaneous TID WC  . insulin aspart  0-5 Units Subcutaneous QHS  . insulin glargine  18 Units Subcutaneous QHS  . lisinopril  2.5 mg Oral Daily  . metoprolol tartrate  12.5 mg Oral BID  . polyethylene glycol  17 g Oral BID  . senna-docusate  1 tablet Oral BID  . DISCONTD: enoxaparin (LOVENOX) injection  30 mg Subcutaneous Q24H      Physical Exam: The patient appears to be in no distress. Oriented this am HEENT normal Neck supple Chest mild decreased BS bases.  Few fine rales bilat. Heart RRR The abdomen is soft and nontender. Bowel sounds are present. Extremities reveal no edema. The right ankle is significantly deformed from prior surgery  Neurologic exam  Legally blind     Lab Results: No results found for this basename: WBC:2,HGB:2,PLT:2 in the last 72 hours  Basename 07/10/12 0500 07/09/12 0920  NA 130* 127*  K 4.3 4.3  CL 94* 93*  CO2 30 25  GLUCOSE 147* 130*  BUN 23 23  CREATININE 1.05 0.87   No results found for this basename: TROPONINI:2,CK,MB:2 in the last 72 hours   Cardiac Studies: [facility]* *[facility]* [address] [place], [place]  [address] [phone]  ------------------------------------------------------------ Transthoracic Echocardiography  Patient: Julia Oliver, Julia Oliver MR #: [id number] Study Date: 07/05/2012 Gender: F Age: 78 Height: 160cm Weight: 73kg BSA: 1.46m^2 Pt. Status: Room: 2927  PERFORMING [facility] ATTENDING Calvert Cantor SONOGRAPHER Emelia Loron, RDCS ORDERING Tresa Moore cc:  ------------------------------------------------------------ LV EF: 50% - 55%  ------------------------------------------------------------ Indications: MI - acute 410.91.  ------------------------------------------------------------ History: Risk factors: Acute coronary syndrome. Hypertension. Diabetes mellitus.  ------------------------------------------------------------ Study Conclusions  - Left ventricle: The cavity size was normal. There was mild focal basal hypertrophy of the septum. Systolic function was normal. The estimated ejection fraction was in the range of 50% to 55%. Regional wall motion abnormalities cannot be excluded. Doppler parameters are consistent with abnormal left ventricular relaxation (grade 1 diastolic dysfunction). Doppler parameters are consistent with high ventricular filling pressure. - Aortic valve: Mild regurgitation. - Mitral valve: Moderately calcified annulus. Mildly thickened leaflets . Mild regurgitation. - Left atrium: The atrium was mildly dilated. - Pulmonary arteries: Systolic pressure was mildly increased.   Assessment/Plan:  Patient Active Hospital Problem List: 1. non-STEMI .  No chest pain.  No wall motion abnormalities seen on echo. Given age and overall medical status, plan medical therapy; continue ASA, metoprolol and statin. 2. long-standing insulin-dependent diabetes mellitus  3. essential hypertension - controlled with present meds 4. Dementia, improved.  5. Acute on chronic diastolic  heart failure- BNP mildly  elevated but euvolemic on exam; continue to hold lasix given hyponatremia.  I have reduced her ASA to 81 mg. Okay for discharge from cardiac standpoint. Cardiology followup with me or Norma Fredrickson NP in several weeks.  Will sign off now.   LOS: 6 days    [name] 07/10/2012, 7:29 AM

## 2012-07-10 NOTE — Progress Notes (Signed)
Physical Therapy Treatment Patient Details Name: Julia Oliver MRN: [id number] DOB: 08/20/1920 Today's Date: 07/10/2012 Time: 4098-1191 PT Time Calculation (min): 19 min  PT Assessment / Plan / Recommendation Comments on Treatment Session  Pt with NSTEMI.  Pt progressing with mobility.  Pt with limited mobility prior to adm and uses w/c primarily.    Follow Up Recommendations  Skilled nursing facility    Barriers to Discharge        Equipment Recommendations  Defer to next venue    Recommendations for Other Services    Frequency Min 2X/week   Plan Discharge plan remains appropriate;Frequency needs to be updated    Precautions / Restrictions Precautions Precautions: Fall Required Braces or Orthoses: Other Brace/Splint Other Brace/Splint: rt AFO   Pertinent Vitals/Pain N/A    Mobility  Bed Mobility Supine to Sit: 4: Min assist;With rails;HOB elevated Sitting - Scoot to Edge of Bed: 5: Supervision Details for Bed Mobility Assistance: Pulls trunk up by pulling on my hand. Transfers Sit to Stand: 4: Min guard;With upper extremity assist;With armrests;From bed Stand to Sit: 4: Min guard;With upper extremity assist;With armrests Stand Pivot Transfers: 4: Min guard;With armrests Details for Transfer Assistance: Chair positioned directly in front of pt.  Pt uses armrests of chair to help stand and then pivots 180 degrees and sits.    Exercises     PT Diagnosis:    PT Problem List:   PT Treatment Interventions:     PT Goals Acute Rehab PT Goals PT Goal: Sit to Stand - Progress: Progressing toward goal PT Goal: Stand to Sit - Progress: Progressing toward goal PT Transfer Goal: Bed to Chair/Chair to Bed - Progress: Progressing toward goal  Visit Information  Last PT Received On: 07/10/12 Assistance Needed: +1    Subjective Data  Subjective: "I got a surprise for breakfast," pt stated about getting something she didn't order.   Cognition  Overall Cognitive Status:  History of cognitive impairments - at baseline Arousal/Alertness: Awake/alert Behavior During Session: [facility] for tasks performed    Balance     End of Session PT - End of Session Equipment Utilized During Treatment: Gait belt Activity Tolerance: Patient tolerated treatment well Patient left: in chair;with call bell/phone within reach Nurse Communication: Mobility status   GP     Dreamer Carillo 07/10/2012, 11:02 AM  Skip Mayer PT [phone]

## 2012-07-10 NOTE — Clinical Social Work Placement (Signed)
Clinical Social Work Department CLINICAL SOCIAL WORK PLACEMENT NOTE 07/10/2012  Patient:  Julia Oliver, Julia Oliver  Account Number:  [id number] Admit date:  07/04/2012  Clinical Social Worker:  Read Drivers  Date/time:  07/10/2012 12:36 PM  Clinical Social Work is seeking post-discharge placement for this patient at the following level of care:   SKILLED NURSING   (*CSW will update this form in Epic as items are completed)   07/09/2012  Patient/family provided with [facility] Department of Clinical Social Work's list of facilities offering this level of care within the geographic area requested by the patient (or if unable, by the patient's family).  07/09/2012  Patient/family informed of their freedom to choose among providers that offer the needed level of care, that participate in Medicare, Medicaid or managed care program needed by the patient, have an available bed and are willing to accept the patient.  07/09/2012  Patient/family informed of [facility]' ownership interest in [facility], as well as of the fact that they are under no obligation to receive care at this facility.  PASARR submitted to EDS on 07/09/2012 PASARR number received from [employer] on 07/09/2012  FL2 transmitted to all facilities in geographic area requested by pt/family on  07/09/2012 FL2 transmitted to all facilities within larger geographic area on 07/10/2012  Patient informed that his/her managed care company has contracts with or will negotiate with  certain facilities, including the following:     Patient/family informed of bed offers received:  07/10/2012 Patient chooses bed at [facility] Physician recommends and patient chooses bed at    Patient to be transferred to [facility] on  07/10/2012 Patient to be transferred to facility by EMS  The following physician request were entered in Epic:   Additional Comments: none  Vickii Penna, LCSWA [phone]  Clinical Social Work

## 2012-07-10 NOTE — Clinical Social Work Psychosocial (Signed)
Clinical Social Work Department BRIEF PSYCHOSOCIAL ASSESSMENT 07/10/2012  Patient:  Julia Oliver, Julia Oliver     Account Number:  [id number]     Admit date:  07/04/2012  Clinical Social Worker:  Read Drivers  Date/Time:  07/09/2012 12:20 PM  Referred by:  RN  Date Referred:  07/09/2012 Referred for  SNF Placement   Other Referral:   none   Interview type:  Other - See comment Other interview type:   pt daughter, Clydie Braun    PSYCHOSOCIAL DATA Living Status:  FAMILY Admitted from facility:   Level of care:   Primary support name:  Clydie Braun Primary support relationship to patient:  CHILD, ADULT Degree of support available:   good    CURRENT CONCERNS Current Concerns  Post-Acute Placement   Other Concerns:   none    SOCIAL WORK ASSESSMENT / PLAN CSW called daughter to assess d/c plans for pt.  Pt daughter is Clydie Braun who works 3rd shift.  She is involved in her mother's care.  She would like for her mother to go to [facility].  Pt has been there in the past and is familiar with that facility.  CSW will facilitate the SNF search and personally f/u with [facility] SN[facility] to check on availability.   Assessment/plan status:  Psychosocial Support/Ongoing Assessment of Needs Other assessment/ plan:   none   Information/referral to community resources:   SNF    PATIENT'S/FAMILY'S RESPONSE TO PLAN OF CARE: very appreciative of CSW assistance        Vickii Penna, [place] [phone]  Clinical Social Work

## 2012-07-10 NOTE — Progress Notes (Signed)
Report called to [place] at [facility]. Pt transported by ambulance to facility.

## 2012-07-10 NOTE — Progress Notes (Signed)
CSW conducted bed search for pt on 8/26.  Pt and family prefer [facility] SNF.  CSW is currently f/u with SNF to confirm bed availability.  CSW is expecting a call back from SNF.  Once bed has been confirmed CSW will text MD to proceed with discharge summary and FL2 signature. Vickii Penna, LCSWA [phone]  Clinical Social Work

## 2012-07-10 NOTE — Discharge Summary (Signed)
Physician Discharge Summary  Julia Oliver ZOX:[id number] DOB: September 10, 1920 DOA: 07/04/2012  PCP: No primary provider on file.  Admit date: 07/04/2012 Discharge date: 07/10/2012  Recommendations for Outpatient Follow-up:  1. Follow up with a BMP in one week.   Discharge Diagnoses:  Principal Problem:  *Acute coronary syndrome Active Problems:  HTN (hypertension)  Diabetes mellitus type II  Hyponatremia   Discharge Condition: stable  Diet recommendation: low salt diet/ carb modified diet.   Filed Weights   07/06/12 0500 07/09/12 0818 07/10/12 0537  Weight: 73.4 kg (161 lb 13.1 oz) 75.4 kg (166 lb 3.6 oz) 74.571 kg (164 lb 6.4 oz)    History of present illness:  78 year old female with past medical history of hypertension, legally blind, diabetes last hemoglobin A1c of 8.5, a baseline creatinine of 0.7 who was transferred here from [facility] for a possible non-ST elevation MI. The patient also has dementia and cannot give a history as per her son this all started about a week ago she complained of chest pain and shortness of breath that lasted about 8 hours, resolved after she had a bowel movement. She was admitted for ACS and a cardiology consult was obtained.   Hospital Course:  Acute coronary syndrome  NSTEMI -  Medical therapy/ conservative management for now by the recommendations of cardiology, including aspirin, statin, low dose ACE inhibitor. She was given one dose of lasix for her mild chf. Echo  - no wall motion abnormalities appreciated on echocardiogram . Cardiac enzymes were slightly elevated initially, but now have come down. Further follow up as outpatient.   HTN (hypertension)  Cont current treatment and follow - well-controlled at this   Diabetes mellitus type II  Poorly controlled but does appear to be improving - A1c noted to be elevated at 8.9 . Resume home regimen on discharge.  CBG (last 3)   Basename  07/09/12 1643  07/09/12 1515  07/09/12 1115    GLUCAP  125*  130*  121*    Hyponatremia  Possibly from lasix, we held the lasix. Currently asymptomatic. And improving, recommend check BMP in one week.   Dementia v/s acute Delirium  Per son she is very oriented and without memory difficulties at home - urinalysis slightly abnormal and cultures show coag neg staph, with sensitivities to tetracyclines. Will order doxycycline on discharge . B12 and folic acid levels within normal.   Acute on chronic diastolic heart failure  Appears to be compensated.    Procedures:  echo  Consultations:  cardiology  Discharge Exam: Filed Vitals:   07/10/12 1047  BP: 132/74  Pulse:   Temp:   Resp:    Filed Vitals:   07/09/12 2011 07/10/12 0537 07/10/12 1046 07/10/12 1047  BP: 129/80 120/71 132/74 132/74  Pulse: 65 62 69   Temp: 98.2 F (36.8 C) 97.7 F (36.5 C)    TempSrc: Oral Oral    Resp: 18 18    Height:      Weight:  74.571 kg (164 lb 6.4 oz)    SpO2: 98% 96%     General: No acute respiratory distress at rest  Lungs: Mild bibasilar crackles with good air movement throughout other fields and no wheeze  Cardiovascular: Regular rate and rhythm without murmur gallop or rub  Abdomen: Nontender, nondistended, soft, bowel sounds positive, no rebound, no ascites, no appreciable mass  Extremities: Trace bilateral lower extremity edema without cyanosis or clubbing   Discharge Instructions   Medication List  As of 07/10/2012 12:13  PM   STOP taking these medications         simvastatin 10 MG tablet         TAKE these medications         aspirin 81 MG tablet   Take 81 mg by mouth daily.      atorvastatin 40 MG tablet   Commonly known as: LIPITOR   Take 1 tablet (40 mg total) by mouth daily at 6 PM.      cholecalciferol 1000 UNITS tablet   Commonly known as: VITAMIN D   Take 1,000 Units by mouth daily.      insulin lispro protamine-insulin lispro (75-25) 100 UNIT/ML Susp   Commonly known as: HUMALOG 75/25   Inject  24-34 Units into the skin 2 (two) times daily with a meal. Take 34 units in the morning and 24 units in the evening      lisinopril 10 MG tablet   Commonly known as: PRINIVIL,ZESTRIL   Take 10 mg by mouth daily.      metoprolol tartrate 12.5 mg Tabs   Commonly known as: LOPRESSOR   Take 0.5 tablets (12.5 mg total) by mouth 2 (two) times daily.      omeprazole 20 MG capsule   Commonly known as: PRILOSEC   Take 20 mg by mouth daily.      polyethylene glycol packet   Commonly known as: MIRALAX / GLYCOLAX   Take 17 g by mouth 2 (two) times daily.      senna-docusate 8.6-50 MG per tablet   Commonly known as: Senokot-S   Take 1 tablet by mouth 2 (two) times daily as needed for constipation.      vitamin B-12 500 MCG tablet   Commonly known as: CYANOCOBALAMIN   Take 250 mcg by mouth daily.              The results of significant diagnostics from this hospitalization (including imaging, microbiology, ancillary and laboratory) are listed below for reference.    Significant Diagnostic Studies: Portable Chest 1 View  07/04/2012  *RADIOLOGY REPORT*  Clinical Data: Shortness of breath.  High blood pressure. Diabetic.  PORTABLE CHEST - 1 VIEW  Comparison: 08/04/2011.  Findings: Cardiomegaly.  Pulmonary vascular congestion.  Early pulmonary edema not entirely excluded.  Tortuous aorta.  No segmental infiltrate or pneumothorax.  IMPRESSION: Cardiomegaly.  Pulmonary vascular congestion.  Early pulmonary edema not entirely excluded.  Tortuous aorta.  No segmental infiltrate or pneumothorax.   Original Report Authenticated By: Fuller Canada, M.D.     Microbiology: Recent Results (from the past 240 hour(s))  MRSA PCR SCREENING     Status: Normal   Collection Time   07/04/12  3:27 PM      Component Value Range Status Comment   MRSA by PCR NEGATIVE  NEGATIVE Final   URINE CULTURE     Status: Normal (Preliminary result)   Collection Time   07/06/12  2:16 PM      Component Value Range  Status Comment   Specimen Description URINE, CLEAN CATCH   Final    Special Requests NONE   Final    Culture  Setup Time 07/06/2012 20:53   Final    Colony Count >=100,000 COLONIES/ML   Final    Culture     Final    Value: STAPHYLOCOCCUS SPECIES (COAGULASE NEGATIVE)     Note: RIFAMPIN AND GENTAMICIN SHOULD NOT BE USED AS SINGLE DRUGS FOR TREATMENT OF STAPH INFECTIONS.   Report Status PENDING  Incomplete      Labs: Basic Metabolic Panel:  Lab 07/10/12 1610 07/09/12 0920 07/08/12 0500 07/07/12 0535 07/05/12 0500  NA 130* 127* 125* 125* 130*  K 4.3 4.3 4.3 4.8 4.4  CL 94* 93* 90* 93* 95*  CO2 30 25 25 21 27   GLUCOSE 147* 130* 143* 211* 251*  BUN 23 23 23  28* 16  CREATININE 1.05 0.87 0.87 0.93 0.83  CALCIUM 8.9 8.8 9.0 8.9 9.1  MG -- -- -- -- --  PHOS -- -- -- -- --   Liver Function Tests:  Lab 07/05/12 0500 07/04/12 1608  AST 21 29  ALT 17 19  ALKPHOS 70 71  BILITOT 1.0 0.8  PROT 6.8 6.9  ALBUMIN 3.2* 3.2*   No results found for this basename: LIPASE:5,AMYLASE:5 in the last 168 hours No results found for this basename: AMMONIA:5 in the last 168 hours CBC:  Lab 07/07/12 0535 07/05/12 0500 07/04/12 1608  WBC 5.5 7.4 6.7  NEUTROABS -- -- 4.9  HGB 12.4 12.3 12.0  HCT 37.6 35.4* 35.2*  MCV 90.6 87.8 86.7  PLT PLATELET CLUMPS NOTED ON SMEAR, UNABLE TO ESTIMATE 161 151   Cardiac Enzymes:  Lab 07/05/12 1011 07/04/12 2336 07/04/12 1607  CKTOTAL 103 134 149  CKMB 4.3* 5.4* 5.9*  CKMBINDEX -- -- --  TROPONINI 4.24* 4.53* 6.72*   BNP: BNP (last 3 results)  Basename 07/08/12 0500 07/04/12 1605  PROBNP 2689.0* 4677.0*   CBG:  Lab 07/10/12 1130 07/10/12 0559 07/09/12 2117 07/09/12 1643 07/09/12 1515  GLUCAP 214* 146* 140* 125* 130*    Time coordinating discharge: 54 minutes  Signed:  Sicilia Killough  [facility] 07/10/2012, 12:13 PM

## 2012-07-11 LAB — FOLATE RBC: RBC Folate: 939 ng/mL — ABNORMAL HIGH (ref >=366)

## 2013-02-12 IMAGING — CT CT HEAD W/O CM
1 series · 16 of 30 positions shown, 20 images · non-contrast
Comparison: None.

CLINICAL DATA: Status post fall.  Pain.

CT HEAD WITHOUT CONTRAST
TECHNIQUE: Contiguous axial images were obtained from the base of
the skull through the vertex without contrast.

[Series 2: head trauma 4.8 h37s · axial · 0.43mm/px · z∈[+772,+927]mm · 16 of 36 slices shown, 20 images]
[im 2/36  brain]
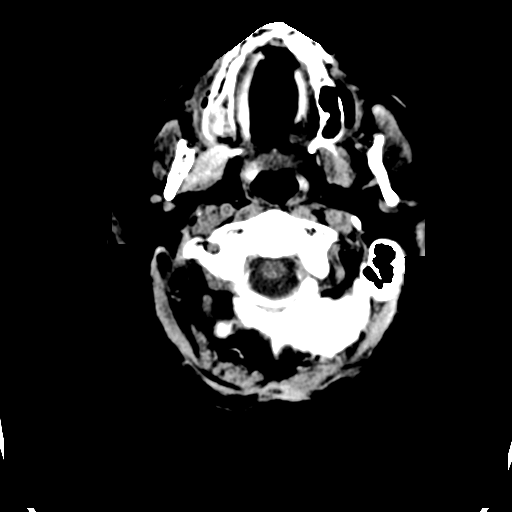
[im 2/36  bone]
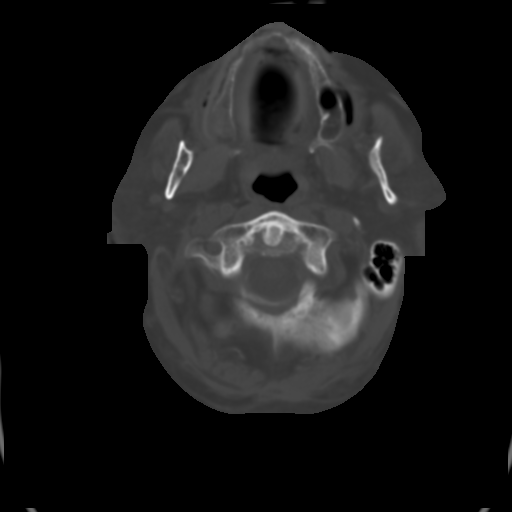
[im 4/36  brain]
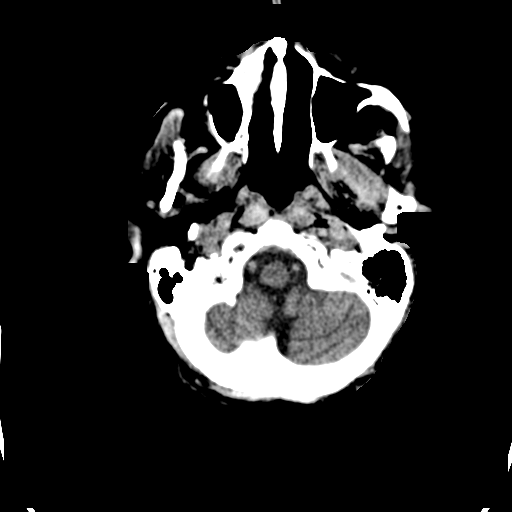
[im 7/36  brain]
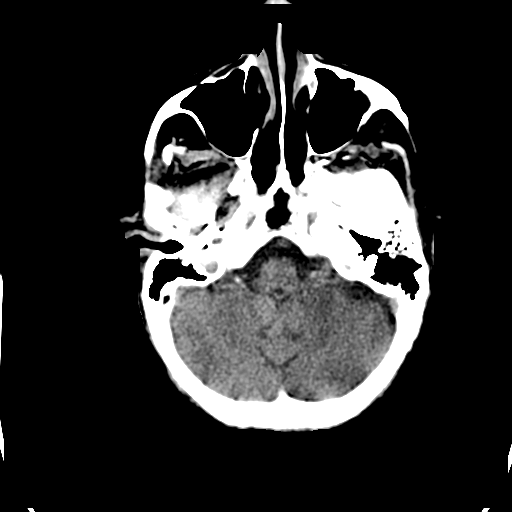
[im 9/36  brain]
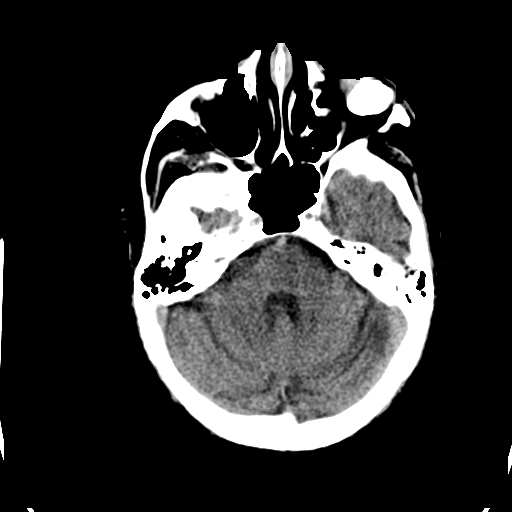
[im 10/36  brain]
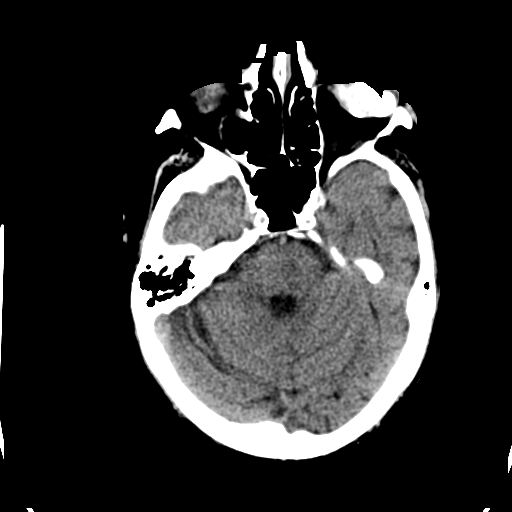
[im 10/36  bone]
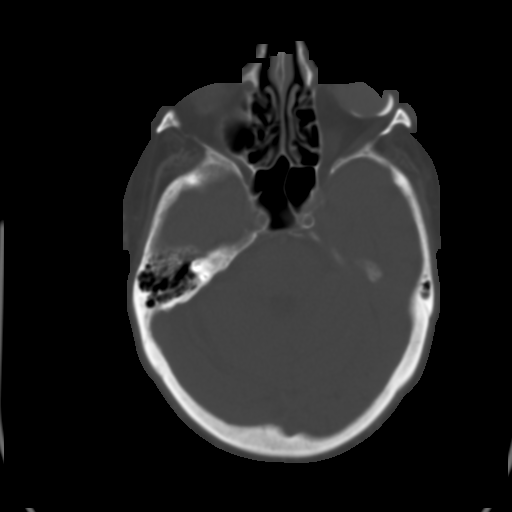
[im 13/36  brain]
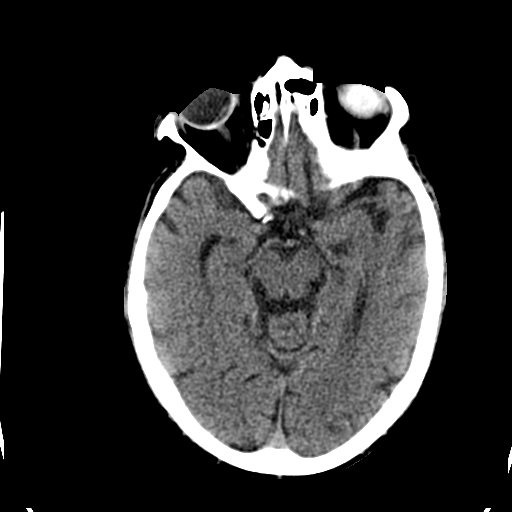
[im 15/36  brain]
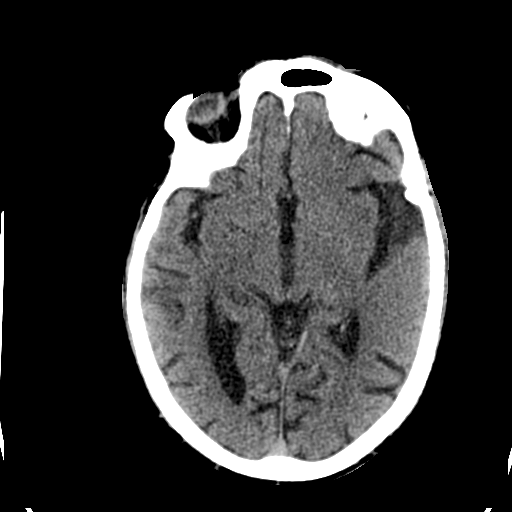
[im 17/36  brain]
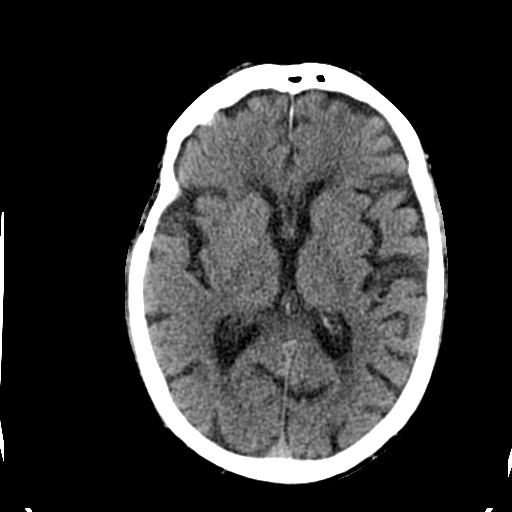
[im 19/36  brain]
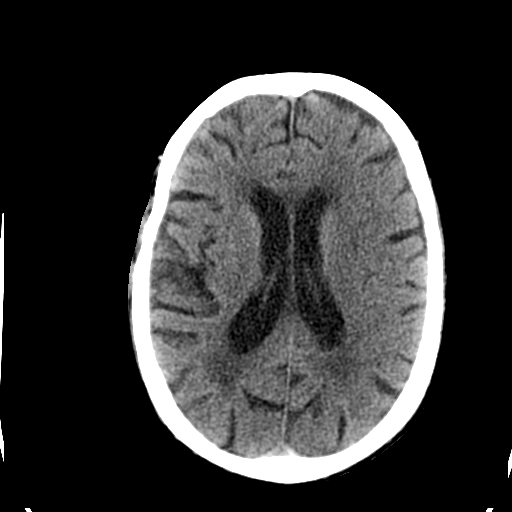
[im 19/36  bone]
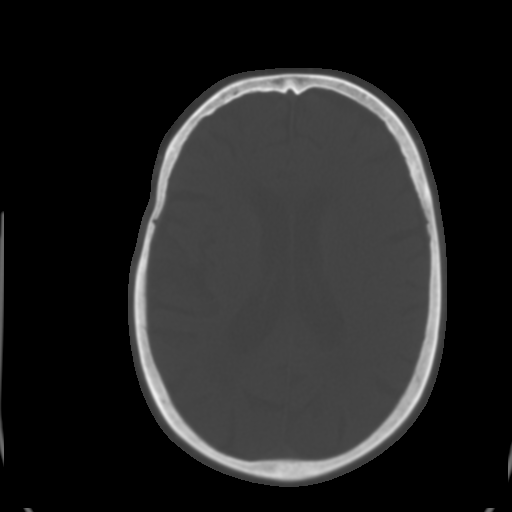
[im 21/36  brain]
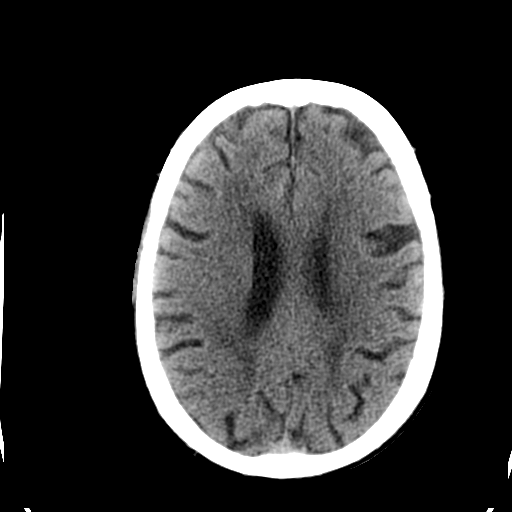
[im 23/36  brain]
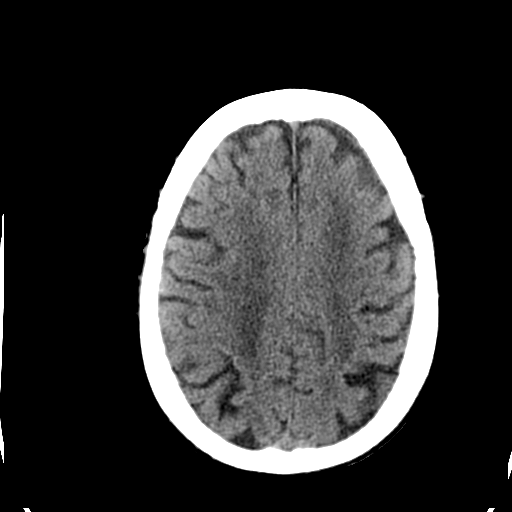
[im 26/36  brain]
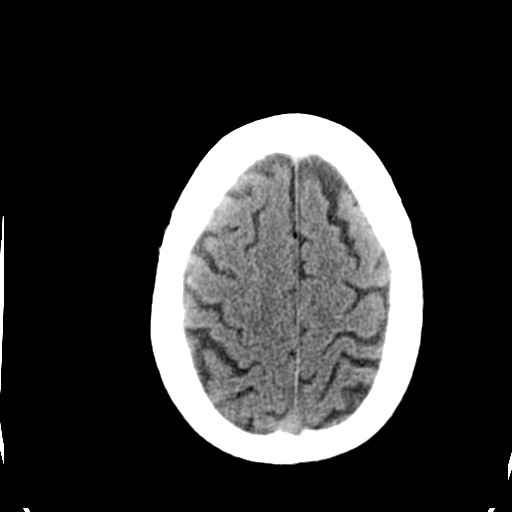
[im 27/36  brain]
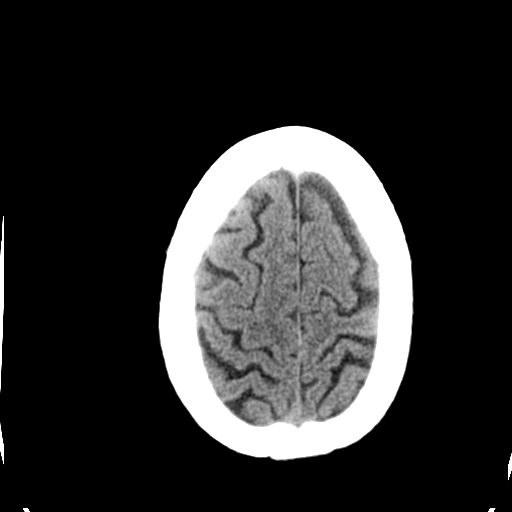
[im 27/36  bone]
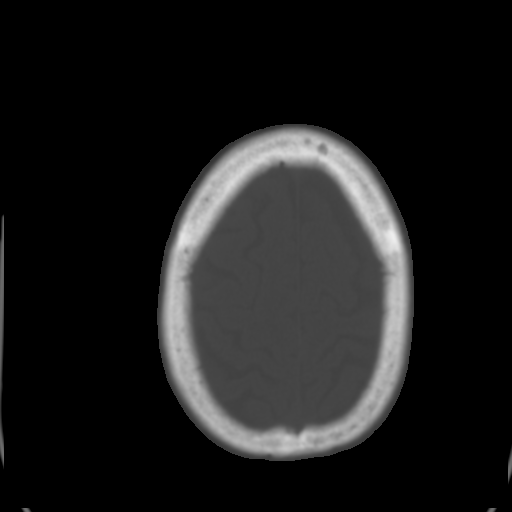
[im 29/36  brain]
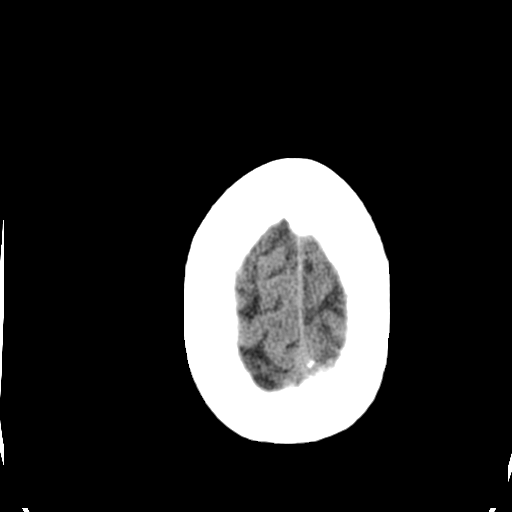
[im 32/36  brain]
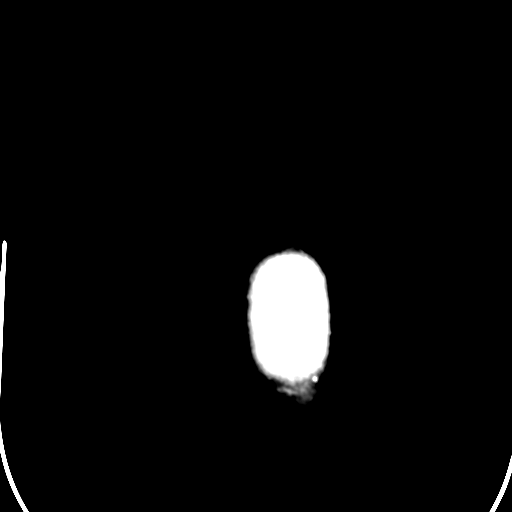
[im 34/36  brain]
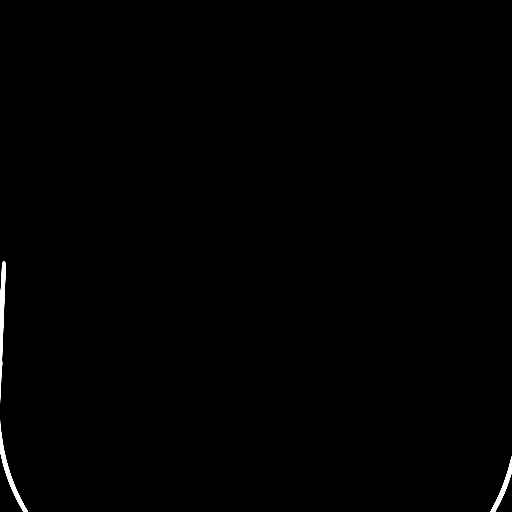

[16 of 30 positions shown; findings below may reference images not displayed]

FINDINGS: Fairly extensive chronic microvascular ischemic change is
noted.  There is no evidence of acute intracranial abnormality
including acute infarction, hemorrhage, mass lesion, mass effect,
midline shift or abnormal extra-axial fluid collection.  No
hydrocephalus or pneumocephalus.  Calvarium intact.  Postoperative
change left orbit noted.
IMPRESSION: No acute finding.

## 2016-06-18 ENCOUNTER — Inpatient Hospital Stay (HOSPITAL_COMMUNITY)
Admission: EM | Admit: 2016-06-18 | Discharge: 2016-06-22 | DRG: 689 | Disposition: A | Discharge status: Skilled Nursing Facility | Payer: Medicare Other | Attending: Internal Medicine | Admitting: Internal Medicine

## 2016-06-18 ENCOUNTER — Emergency Department (HOSPITAL_COMMUNITY): Payer: Medicare Other

## 2016-06-18 ENCOUNTER — Encounter (HOSPITAL_COMMUNITY): Payer: Self-pay | Admitting: Emergency Medicine

## 2016-06-18 DIAGNOSIS — F05 Delirium due to known physiological condition: Secondary | ICD-10-CM | POA: Diagnosis present

## 2016-06-18 DIAGNOSIS — H9193 Unspecified hearing loss, bilateral: Secondary | ICD-10-CM | POA: Diagnosis present

## 2016-06-18 DIAGNOSIS — H109 Unspecified conjunctivitis: Secondary | ICD-10-CM | POA: Diagnosis present

## 2016-06-18 DIAGNOSIS — F4489 Other dissociative and conversion disorders: Secondary | ICD-10-CM | POA: Diagnosis not present

## 2016-06-18 DIAGNOSIS — Z794 Long term (current) use of insulin: Secondary | ICD-10-CM

## 2016-06-18 DIAGNOSIS — R441 Visual hallucinations: Secondary | ICD-10-CM | POA: Diagnosis present

## 2016-06-18 DIAGNOSIS — Z79899 Other long term (current) drug therapy: Secondary | ICD-10-CM

## 2016-06-18 DIAGNOSIS — Z880 Allergy status to penicillin: Secondary | ICD-10-CM

## 2016-06-18 DIAGNOSIS — I10 Essential (primary) hypertension: Secondary | ICD-10-CM | POA: Diagnosis present

## 2016-06-18 DIAGNOSIS — R7989 Other specified abnormal findings of blood chemistry: Secondary | ICD-10-CM

## 2016-06-18 DIAGNOSIS — N289 Disorder of kidney and ureter, unspecified: Secondary | ICD-10-CM

## 2016-06-18 DIAGNOSIS — Z881 Allergy status to other antibiotic agents status: Secondary | ICD-10-CM

## 2016-06-18 DIAGNOSIS — N390 Urinary tract infection, site not specified: Principal | ICD-10-CM | POA: Diagnosis present

## 2016-06-18 DIAGNOSIS — H540 Blindness, both eyes: Secondary | ICD-10-CM | POA: Diagnosis present

## 2016-06-18 DIAGNOSIS — Z888 Allergy status to other drugs, medicaments and biological substances status: Secondary | ICD-10-CM

## 2016-06-18 DIAGNOSIS — R410 Disorientation, unspecified: Secondary | ICD-10-CM

## 2016-06-18 DIAGNOSIS — Z7982 Long term (current) use of aspirin: Secondary | ICD-10-CM

## 2016-06-18 DIAGNOSIS — E872 Acidosis: Secondary | ICD-10-CM | POA: Diagnosis present

## 2016-06-18 DIAGNOSIS — L308 Other specified dermatitis: Secondary | ICD-10-CM | POA: Diagnosis present

## 2016-06-18 DIAGNOSIS — E119 Type 2 diabetes mellitus without complications: Secondary | ICD-10-CM

## 2016-06-18 DIAGNOSIS — G9340 Encephalopathy, unspecified: Secondary | ICD-10-CM | POA: Diagnosis present

## 2016-06-18 DIAGNOSIS — E876 Hypokalemia: Secondary | ICD-10-CM | POA: Diagnosis present

## 2016-06-18 DIAGNOSIS — G9341 Metabolic encephalopathy: Secondary | ICD-10-CM | POA: Diagnosis present

## 2016-06-18 HISTORY — DX: Urinary tract infection, site not specified: N39.0

## 2016-06-18 HISTORY — DX: Transient cerebral ischemic attack, unspecified: G45.9

## 2016-06-18 HISTORY — DX: Chronic obstructive pulmonary disease, unspecified: J44.9

## 2016-06-18 HISTORY — DX: Gastro-esophageal reflux disease without esophagitis: K21.9

## 2016-06-18 HISTORY — DX: Unspecified osteoarthritis, unspecified site: M19.90

## 2016-06-18 HISTORY — DX: Cerebral infarction, unspecified: I63.9

## 2016-06-18 HISTORY — DX: Type 2 diabetes mellitus without complications: E11.9

## 2016-06-18 HISTORY — DX: Pure hypercholesterolemia, unspecified: E78.00

## 2016-06-18 LAB — CBC WITH DIFFERENTIAL/PLATELET
BASOS ABS: 0 10*3/uL (ref 0.0–0.1)
BASOS PCT: 0 %
Eosinophils Absolute: 0 10*3/uL (ref 0.0–0.7)
Eosinophils Relative: 0 %
HEMATOCRIT: 41.1 % (ref 36.0–46.0)
HEMOGLOBIN: 13.8 g/dL (ref 12.0–15.0)
Lymphocytes Relative: 18 %
Lymphs Abs: 0.9 10*3/uL (ref 0.7–4.0)
MCH: 30.9 pg (ref 26.0–34.0)
MCHC: 33.6 g/dL (ref 30.0–36.0)
MCV: 92.2 fL (ref 78.0–100.0)
MONOS PCT: 10 %
Monocytes Absolute: 0.5 10*3/uL (ref 0.1–1.0)
NEUTROS ABS: 3.8 10*3/uL (ref 1.7–7.7)
NEUTROS PCT: 72 %
Platelets: 134 10*3/uL — ABNORMAL LOW (ref 150–400)
RBC: 4.46 MIL/uL (ref 3.87–5.11)
RDW: 13.2 % (ref 11.5–15.5)
WBC: 5.2 10*3/uL (ref 4.0–10.5)

## 2016-06-18 LAB — URINE MICROSCOPIC-ADD ON: RBC / HPF: NONE SEEN RBC/hpf (ref 0–5)

## 2016-06-18 LAB — COMPREHENSIVE METABOLIC PANEL
ALBUMIN: 3.6 g/dL (ref 3.5–5.0)
ALT: 16 U/L (ref 14–54)
AST: 25 U/L (ref 15–41)
Alkaline Phosphatase: 79 U/L (ref 38–126)
Anion gap: 8 (ref 5–15)
BUN: 18 mg/dL (ref 6–20)
CHLORIDE: 96 mmol/L — AB (ref 101–111)
CO2: 28 mmol/L (ref 22–32)
Calcium: 8.9 mg/dL (ref 8.9–10.3)
Creatinine, Ser: 1.14 mg/dL — ABNORMAL HIGH (ref 0.44–1.00)
GFR calc Af Amer: 46 mL/min — ABNORMAL LOW (ref >60)
GFR, EST NON AFRICAN AMERICAN: 40 mL/min — AB (ref >60)
Glucose, Bld: 277 mg/dL — ABNORMAL HIGH (ref 65–99)
POTASSIUM: 3.7 mmol/L (ref 3.5–5.1)
SODIUM: 132 mmol/L — AB (ref 135–145)
Total Bilirubin: 1.5 mg/dL — ABNORMAL HIGH (ref 0.3–1.2)
Total Protein: 7.3 g/dL (ref 6.5–8.1)

## 2016-06-18 LAB — URINALYSIS, ROUTINE W REFLEX MICROSCOPIC
Bilirubin Urine: NEGATIVE
Glucose, UA: 100 mg/dL — AB
Ketones, ur: NEGATIVE mg/dL
NITRITE: NEGATIVE
PH: 6.5 (ref 5.0–8.0)
Protein, ur: NEGATIVE mg/dL
SPECIFIC GRAVITY, URINE: 1.014 (ref 1.005–1.030)

## 2016-06-18 LAB — I-STAT CG4 LACTIC ACID, ED
LACTIC ACID, VENOUS: 3.65 mmol/L — AB (ref 0.5–1.9)
Lactic Acid, Venous: 3 mmol/L (ref 0.5–1.9)

## 2016-06-18 LAB — GLUCOSE, CAPILLARY: GLUCOSE-CAPILLARY: 224 mg/dL — AB (ref 65–99)

## 2016-06-18 MED ORDER — TRAZODONE HCL 50 MG PO TABS
50.0000 mg | ORAL_TABLET | Freq: Every evening | ORAL | Status: DC | PRN
  Administered 2016-06-18 – 2016-06-20 (×2): 50 mg via ORAL
  Filled 2016-06-18 (×3): qty 1

## 2016-06-18 MED ORDER — ACETAMINOPHEN 650 MG RE SUPP: 650.0000 mg | Freq: Four times a day (QID) | RECTAL | Status: DC | PRN

## 2016-06-18 MED ORDER — ALBUTEROL SULFATE (2.5 MG/3ML) 0.083% IN NEBU: 2.5000 mg | INHALATION_SOLUTION | RESPIRATORY_TRACT | Status: DC | PRN

## 2016-06-18 MED ORDER — INSULIN ASPART 100 UNIT/ML ~~LOC~~ SOLN
0.0000 [IU] | Freq: Three times a day (TID) | SUBCUTANEOUS | Status: DC
  Administered 2016-06-19: 7 [IU] via SUBCUTANEOUS
  Administered 2016-06-19: 3 [IU] via SUBCUTANEOUS
  Administered 2016-06-19: 7 [IU] via SUBCUTANEOUS
  Administered 2016-06-20 (×2): 1 [IU] via SUBCUTANEOUS
  Administered 2016-06-20: 2 [IU] via SUBCUTANEOUS
  Administered 2016-06-21 – 2016-06-22 (×3): 1 [IU] via SUBCUTANEOUS

## 2016-06-18 MED ORDER — DEXTROSE 5 % IV SOLN
1.0000 g | INTRAVENOUS | Status: DC
  Filled 2016-06-18: qty 10

## 2016-06-18 MED ORDER — CALCIUM POLYCARBOPHIL 625 MG PO TABS
625.0000 mg | ORAL_TABLET | Freq: Every day | ORAL | Status: DC
  Administered 2016-06-19 – 2016-06-22 (×4): 625 mg via ORAL
  Filled 2016-06-18 (×4): qty 1

## 2016-06-18 MED ORDER — LEVOFLOXACIN IN D5W 500 MG/100ML IV SOLN
500.0000 mg | Freq: Once | INTRAVENOUS | Status: AC
  Administered 2016-06-18: 500 mg via INTRAVENOUS
  Filled 2016-06-18: qty 100

## 2016-06-18 MED ORDER — SODIUM CHLORIDE 0.9 % IV BOLUS (SEPSIS)
30.0000 mL/kg | Freq: Once | INTRAVENOUS | Status: AC
  Administered 2016-06-18: 2244 mL via INTRAVENOUS

## 2016-06-18 MED ORDER — ASPIRIN EC 81 MG PO TBEC
81.0000 mg | DELAYED_RELEASE_TABLET | Freq: Every day | ORAL | Status: DC
  Administered 2016-06-19 – 2016-06-22 (×4): 81 mg via ORAL
  Filled 2016-06-18 (×4): qty 1

## 2016-06-18 MED ORDER — LISINOPRIL 2.5 MG PO TABS
5.0000 mg | ORAL_TABLET | Freq: Every day | ORAL | Status: DC
  Administered 2016-06-19 – 2016-06-22 (×4): 5 mg via ORAL
  Filled 2016-06-18 (×4): qty 2

## 2016-06-18 MED ORDER — DEXTROSE 5 % IV SOLN
1.0000 g | INTRAVENOUS | Status: DC
  Administered 2016-06-19 – 2016-06-21 (×3): 1 g via INTRAVENOUS
  Filled 2016-06-18 (×4): qty 10

## 2016-06-18 MED ORDER — ATORVASTATIN CALCIUM 40 MG PO TABS: 40.0000 mg | ORAL_TABLET | Freq: Every day | ORAL | Status: DC

## 2016-06-18 MED ORDER — INSULIN ASPART PROT & ASPART (70-30 MIX) 100 UNIT/ML ~~LOC~~ SUSP
30.0000 [IU] | Freq: Every day | SUBCUTANEOUS | Status: DC
  Filled 2016-06-18: qty 10

## 2016-06-18 MED ORDER — ATORVASTATIN CALCIUM 10 MG PO TABS
10.0000 mg | ORAL_TABLET | Freq: Every day | ORAL | Status: DC
Start: 2016-06-19 — End: 2016-06-22
  Administered 2016-06-19 – 2016-06-22 (×4): 10 mg via ORAL
  Filled 2016-06-18 (×4): qty 1

## 2016-06-18 MED ORDER — INSULIN GLARGINE 100 UNIT/ML ~~LOC~~ SOLN
20.0000 [IU] | Freq: Every day | SUBCUTANEOUS | Status: DC
  Filled 2016-06-18: qty 0.2

## 2016-06-18 MED ORDER — SODIUM CHLORIDE 0.9% FLUSH: 3.0000 mL | INTRAVENOUS | Status: DC | PRN

## 2016-06-18 MED ORDER — SODIUM CHLORIDE 0.9 % IV SOLN
INTRAVENOUS | Status: DC
  Administered 2016-06-18 – 2016-06-19 (×2): via INTRAVENOUS

## 2016-06-18 MED ORDER — CETYLPYRIDINIUM CHLORIDE 0.05 % MT LIQD
7.0000 mL | Freq: Two times a day (BID) | OROMUCOSAL | Status: DC
  Administered 2016-06-18 – 2016-06-22 (×8): 7 mL via OROMUCOSAL

## 2016-06-18 MED ORDER — POLYETHYLENE GLYCOL 3350 17 G PO PACK: 17.0000 g | PACK | Freq: Every day | ORAL | Status: DC | PRN

## 2016-06-18 MED ORDER — SODIUM CHLORIDE 0.9 % IV BOLUS (SEPSIS): 1000.0000 mL | Freq: Once | INTRAVENOUS | Status: DC

## 2016-06-18 MED ORDER — ASPIRIN EC 81 MG PO TBEC: 81.0000 mg | DELAYED_RELEASE_TABLET | Freq: Every day | ORAL | Status: DC

## 2016-06-18 MED ORDER — SODIUM CHLORIDE 0.9 % IV SOLN: INTRAVENOUS | Status: DC

## 2016-06-18 MED ORDER — SODIUM CHLORIDE 0.9 % IV SOLN: 250.0000 mL | INTRAVENOUS | Status: DC | PRN

## 2016-06-18 MED ORDER — ACETAMINOPHEN 325 MG PO TABS: 650.0000 mg | ORAL_TABLET | Freq: Four times a day (QID) | ORAL | Status: DC | PRN

## 2016-06-18 MED ORDER — SENNA 8.6 MG PO TABS
1.0000 | ORAL_TABLET | Freq: Two times a day (BID) | ORAL | Status: DC
  Administered 2016-06-18 – 2016-06-22 (×7): 8.6 mg via ORAL
  Filled 2016-06-18 (×7): qty 1

## 2016-06-18 MED ORDER — HALOPERIDOL 2 MG PO TABS
2.0000 mg | ORAL_TABLET | Freq: Four times a day (QID) | ORAL | Status: DC | PRN
  Filled 2016-06-18: qty 1

## 2016-06-18 MED ORDER — HEPARIN SODIUM (PORCINE) 5000 UNIT/ML IJ SOLN
5000.0000 [IU] | Freq: Three times a day (TID) | INTRAMUSCULAR | Status: DC
  Administered 2016-06-18 – 2016-06-22 (×12): 5000 [IU] via SUBCUTANEOUS
  Filled 2016-06-18 (×11): qty 1

## 2016-06-18 MED ORDER — SODIUM CHLORIDE 0.9% FLUSH
3.0000 mL | Freq: Two times a day (BID) | INTRAVENOUS | Status: DC
Start: 2016-06-18 — End: 2016-06-22
  Administered 2016-06-18 – 2016-06-22 (×4): 3 mL via INTRAVENOUS

## 2016-06-18 MED ORDER — INSULIN ASPART PROT & ASPART (70-30 MIX) 100 UNIT/ML ~~LOC~~ SUSP
20.0000 [IU] | Freq: Every day | SUBCUTANEOUS | Status: DC
  Filled 2016-06-18: qty 10

## 2016-06-18 MED ORDER — DOCUSATE SODIUM 100 MG PO CAPS: 100.0000 mg | ORAL_CAPSULE | Freq: Two times a day (BID) | ORAL | Status: DC | PRN

## 2016-06-18 MED ORDER — PANTOPRAZOLE SODIUM 40 MG PO TBEC
40.0000 mg | DELAYED_RELEASE_TABLET | Freq: Every day | ORAL | Status: DC
  Administered 2016-06-19 – 2016-06-22 (×4): 40 mg via ORAL
  Filled 2016-06-18 (×4): qty 1

## 2016-06-18 MED ORDER — METOPROLOL TARTRATE 25 MG PO TABS
25.0000 mg | ORAL_TABLET | Freq: Two times a day (BID) | ORAL | Status: DC
  Administered 2016-06-18 – 2016-06-22 (×8): 25 mg via ORAL
  Filled 2016-06-18 (×8): qty 1

## 2016-06-18 MED ORDER — ONDANSETRON HCL 4 MG/2ML IJ SOLN: 4.0000 mg | Freq: Four times a day (QID) | INTRAMUSCULAR | Status: DC | PRN

## 2016-06-18 MED ORDER — ONDANSETRON HCL 4 MG PO TABS: 4.0000 mg | ORAL_TABLET | Freq: Four times a day (QID) | ORAL | Status: DC | PRN

## 2016-06-18 NOTE — Progress Notes (Signed)
Julia Oliver [id number] Admitted to 5M84: 06/18/2016 8:58 PM Attending Provider: Shon Hale, MD    Julia Oliver is a 82 y.o. female patient admitted from ED awake, alert  & orientated  X 3,  Full Code, VSS - Blood pressure 124/63, pulse 80, temperature 97.8 F (36.6 C), temperature source Oral, resp. rate 18, weight 74.8 kg (165 lb), SpO2 98 %.RA, no c/o shortness of breath, no c/o chest pain, no distress noted.    IV site WDL:  with a transparent dsg that's clean dry and intact.  Allergies:   Allergies  Allergen Reactions  . Novocain [Procaine Hcl] Other (See Comments)    Pt. passed out  . Penicillins Hives  . Sulfa Antibiotics Nausea And Vomiting     Past Medical History:  Diagnosis Date  . Blindness, bilateral   . Diabetes mellitus   . Hypertension   . UTI (lower urinary tract infection)     History:  obtained from patient  Pt orientation to unit, room and routine. Information packet given to patient.  Admission INP armband ID verified with patient and [name] , and in place. SR up x 2, fall risk assessment complete with Patient and family verbalizing understanding of risks associated with falls. Pt verbalizes an understanding of how to use the call bell and to call for help before getting out of bed.  Skin, has scattered bruises, and two skin tears located on L buttocks and RLE.  Wounds cleansed and tegederms applied  Will cont to monitor and assist as needed.  [name], RN 06/18/2016 8:58 PM

## 2016-06-18 NOTE — ED Notes (Signed)
Attempted report to 5W. 

## 2016-06-18 NOTE — ED Provider Notes (Signed)
[facility]-EMERGENCY DEPT Provider Note   CSN: [id number] Arrival date & time: 06/18/16  1043  First Provider Contact:  First MD Initiated Contact with Patient 06/18/16 1212        History   Chief Complaint Chief Complaint  Patient presents with  . Urinary Tract Infection  . Blood Infection    HPI Julia Oliver is a 82 y.o. female.  HPI Family members report the patient was diagnosed with a urinary tract infection 2 days ago. States she has gotten worse since that time. She was seen at [facility] in [place]. Family reports she was given an IV dose of antibiotics and prescribed ciprofloxacin which she took yesterday. They report she has gotten worse since that time. They report she has been "delirious". Patient has reported seeing trees in her room and water falls. They have not documented fever. They report she has not been vomiting. They have not given her any of her usual tramadol since she's been sick. No other new medications except ciprofloxacin. Family members report that it is been very difficult to care for her in the home because typically she can follow instructions and help with transferring and basic care. Past Medical History:  Diagnosis Date  . Blindness, bilateral   . Diabetes mellitus   . Hypertension   . UTI (lower urinary tract infection)     Patient Active Problem List   Diagnosis Date Noted  . Acute coronary syndrome (HCC) 07/04/2012  . HTN (hypertension) 07/04/2012  . Diabetes mellitus type II 07/04/2012  . Hyponatremia 07/04/2012    Past Surgical History:  Procedure Laterality Date  . APPENDECTOMY    . CHOLECYSTECTOMY    . EYE SURGERY     multiple surgeries for bilat blindness  . FRACTURE SURGERY     right lower extremity fracture repair  . right foot fracture    . right leg fracture      OB History    No data available       Home Medications    Prior to Admission medications   Medication Sig Start Date End Date Taking? Authorizing  Provider  aspirin 81 MG tablet Take 81 mg by mouth daily.   Yes Historical Provider, MD  atorvastatin (LIPITOR) 10 MG tablet Take 10 mg by mouth daily.   Yes Historical Provider, MD  Calcium Polycarbophil (FIBER) 625 MG TABS Take 625 mg by mouth daily.   Yes Historical Provider, MD  cholecalciferol (VITAMIN D) 1000 UNITS tablet Take 1,000 Units by mouth daily.   Yes Historical Provider, MD  clotrimazole-betamethasone (LOTRISONE) cream Apply 1 application topically 2 (two) times daily.   Yes Historical Provider, MD  docusate sodium (COLACE) 100 MG capsule Take 100 mg by mouth 2 (two) times daily as needed for mild constipation.   Yes Historical Provider, MD  furosemide (LASIX) 40 MG tablet Take 40 mg by mouth daily.   Yes Historical Provider, MD  insulin NPH-regular Human (NOVOLIN 70/30) (70-30) 100 UNIT/ML injection Inject 20-30 Units into the skin See admin instructions. 30 units in am and 20 units in pm   Yes Historical Provider, MD  metoprolol tartrate (LOPRESSOR) 12.5 mg TABS Take 0.5 tablets (12.5 mg total) by mouth 2 (two) times daily. Patient taking differently: Take 25 mg by mouth 2 (two) times daily.  07/10/12  Yes Kathlen Mody, MD  mometasone (ELOCON) 0.1 % cream Apply 1 application topically daily.   Yes Historical Provider, MD  omeprazole (PRILOSEC) 20 MG capsule Take 20 mg by mouth daily.  Yes Historical Provider, MD  polyethylene glycol (MIRALAX / GLYCOLAX) packet Take 17 g by mouth daily as needed for mild constipation. Alternates with colace po   Yes Historical Provider, MD  traMADol (ULTRAM) 50 MG tablet Take 50 mg by mouth at bedtime as needed.   Yes Historical Provider, MD  vitamin B-12 (CYANOCOBALAMIN) 500 MCG tablet Take 250 mcg by mouth daily.   Yes Historical Provider, MD  atorvastatin (LIPITOR) 40 MG tablet Take 1 tablet (40 mg total) by mouth daily at 6 PM. 07/10/12 07/10/13  Kathlen Mody, MD  lisinopril (PRINIVIL,ZESTRIL) 2.5 MG tablet Take 1 tablet (2.5 mg total) by mouth  daily. Patient taking differently: Take 5 mg by mouth daily.  07/10/12 07/10/13  Kathlen Mody, MD    Family History No family history on file.  Social History Social History  Substance Use Topics  . Smoking status: Never Smoker  . Smokeless tobacco: Never Used  . Alcohol use No     Allergies   Novocain [procaine hcl]; Penicillins; and Sulfa antibiotics   Review of Systems Review of Systems 10 Systems reviewed and are negative for acute change except as noted in the HPI.   Physical Exam Updated Vital Signs BP (!) 116/54 (BP Location: Right Arm)   Pulse 68   Temp 97.7 F (36.5 C) (Oral)   Resp 18   SpO2 99%   Physical Exam  Constitutional: She appears well-developed and well-nourished. No distress.  HENT:  Head: Normocephalic and atraumatic.  Nose: Nose normal.  Patient is edentulous. Mucous membranes are slightly dry.  Eyes: EOM are normal.  Patient is also completely blind. Cornea are milky. Patient has slight yellow discharge and lashes around the right eye.  Neck: Neck supple.  Cardiovascular: Normal rate, regular rhythm, normal heart sounds and intact distal pulses.   Pulmonary/Chest: Effort normal and breath sounds normal.  Abdominal: Soft. She exhibits no distension. There is no tenderness.  Musculoskeletal:  Patient wears brace on right lower extremity. He is a minor skin tear on the right anterior shin. This is new. No active bleeding. It is minor. No gross edema.  Neurological: She is alert. No cranial nerve deficit. She exhibits normal muscle tone. Coordination normal.     ED Treatments / Results  Labs (all labs ordered are listed, but only abnormal results are displayed) Labs Reviewed  COMPREHENSIVE METABOLIC PANEL - Abnormal; Notable for the following:       Result Value   Sodium 132 (*)    Chloride 96 (*)    Glucose, Bld 277 (*)    Creatinine, Ser 1.14 (*)    Total Bilirubin 1.5 (*)    GFR calc non Af Amer 40 (*)    GFR calc Af Amer 46 (*)     All other components within normal limits  CBC WITH DIFFERENTIAL/PLATELET - Abnormal; Notable for the following:    Platelets 134 (*)    All other components within normal limits  I-STAT CG4 LACTIC ACID, ED - Abnormal; Notable for the following:    Lactic Acid, Venous 3.65 (*)    All other components within normal limits  I-STAT CG4 LACTIC ACID, ED - Abnormal; Notable for the following:    Lactic Acid, Venous 3.00 (*)    All other components within normal limits  CULTURE, BLOOD (ROUTINE X 2)  CULTURE, BLOOD (ROUTINE X 2)  URINE CULTURE  URINALYSIS, ROUTINE W REFLEX MICROSCOPIC (NOT AT [facility])    EKG  EKG Interpretation None  Radiology Dg Chest 2 View  Result Date: 06/18/2016 CLINICAL DATA:  Shortness of breath.  Sepsis. EXAM: CHEST  2 VIEW COMPARISON:  07/04/2012 FINDINGS: Normal heart size. There is no pleural effusion or edema. No airspace consolidation. Coarsened interstitial markings are present within the lungs. IMPRESSION: 1. No acute findings. 2. Chronic interstitial coarsening noted. Electronically Signed   By: Signa Kell M.D.   On: 06/18/2016 12:01    Procedures Procedures (including critical care time)  Medications Ordered in ED Medications  levofloxacin (LEVAQUIN) IVPB 500 mg (not administered)  sodium chloride 0.9 % bolus 30 mL/kg (not administered)     Initial Impression / Assessment and Plan / ED Course  I have reviewed the triage vital signs and the nursing notes.  Pertinent labs & imaging results that were available during my care of the patient were reviewed by me and considered in my medical decision making (see chart for details).  Clinical Course   Consult: [facility] for admission. As urine culture from 8/2 is not positive, requests CT head for additional diagnostic evaluation of mental status change.  Final Clinical Impressions(s) / ED Diagnoses   Final diagnoses:  Delirium  High serum lactic acid  Renal insufficiency    Patient is brought to the emergency department with episodes of confusion suggestive of delirium. Patient is exhibiting visual hallucinations periodically. This was suspected to be due to urinary tract infection. At this time however, this is in question as the culture results from patient's urinalysis are not positive. A she does have high serum lactic acid and also renal insufficiency. She may have dehydration. Fluid resuscitation has been initiated. Plan will be for him for admission by [facility].  New Prescriptions New Prescriptions   No medications on file     Arby Barrette, MD 06/18/16 [phone]

## 2016-06-18 NOTE — ED Notes (Signed)
Attempted to call report at this time 

## 2016-06-18 NOTE — H&P (Signed)
Patient Demographics:    Julia Oliver, is a 82 y.o. female  MRN: [id number]   DOB - 09/07/1920  Admit Date - 06/18/2016  Outpatient Primary MD for the patient is Default, Provider, MD   Assessment & Plan:    Principal Problem:   Confusion state Active Problems:   HTN (hypertension)   Diabetes mellitus, type II (HCC)   Confusion    1)Metabolic encephalopathy/delirium most likely secondary to UTI- confusional episodes with visual hallucinations most likely related to UTI, CT head without acute findings, treat empirically with IV Rocephin pending urine and blood cultures, lactic acid elevated, patient received IV fluid boluses in the ED, repeat lactic acid is still elevated, re-bolused with 1 L of normal saline and recheck lactic acid. Urine culture from 06/15/2016 done at [facility] was negative. Patient was treated with Cipro from 06/15/16 to  06/18/2016. No CVA area tenderness no evidence for pyelonephritis . Hold tramadol due to confusional episodes, okay to use haloperidol when necessary agitation  2)DM- stable, resume home regimen,, Use Novolog/Humalog Sliding scale insulin with Accu-Cheks/Fingersticks as ordered   3)HTN-stable, continue metoprolol, lisinopril, hold Lasix   With History of - Reviewed by me  Past Medical History:  Diagnosis Date  . Blindness, bilateral   . Diabetes mellitus   . Hypertension   . UTI (lower urinary tract infection)       Past Surgical History:  Procedure Laterality Date  . APPENDECTOMY    . CHOLECYSTECTOMY    . EYE SURGERY     multiple surgeries for bilat blindness  . FRACTURE SURGERY     right lower extremity fracture repair  . right foot fracture    . right leg fracture        Chief Complaint  Patient presents with  . Urinary Tract  Infection  . Blood Infection      HPI:    Julia Oliver  is a 82 y.o. female, With past medical history relevant for hypertension diabetes and vision loss who presents with family with concerns about worsening delirium/confusional episodes with visual hallucinations. Patient was seen at [facility] on 06/15/2016 diagnosed with a UTI and started on Cipro, she has been compliant with Cipro but confusional episodes with visual hallucinations have gotten worse, no fevers no vomiting no diarrhea no flank pain no abdominal pain. Family members at bedside, questions answered    Review of systems:    In addition to the HPI above,   A full 12 point Review of Systems was done, except as stated above, all other Review of Systems were negative.    Social History:  Reviewed by me    Social History  Substance Use Topics  . Smoking status: Never Smoker  . Smokeless tobacco: Never Used  . Alcohol use No       Family History :  Reviewed by me   No family history on file.  Home Medications:   Prior to Admission medications   Medication Sig Start Date End Date Taking? Authorizing Provider  aspirin 81 MG tablet Take 81 mg by mouth daily.   Yes Historical Provider, MD  atorvastatin (LIPITOR) 10 MG tablet Take 10 mg by mouth daily.   Yes Historical Provider, MD  Calcium Polycarbophil (FIBER) 625 MG TABS Take 625 mg by mouth daily.   Yes Historical Provider, MD  cholecalciferol (VITAMIN D) 1000 UNITS tablet Take 1,000 Units by mouth daily.   Yes Historical Provider, MD  clotrimazole-betamethasone (LOTRISONE) cream Apply 1 application topically 2 (two) times daily.   Yes Historical Provider, MD  docusate sodium (COLACE) 100 MG capsule Take 100 mg by mouth 2 (two) times daily as needed for mild constipation.   Yes Historical Provider, MD  furosemide (LASIX) 40 MG tablet Take 40 mg by mouth daily.   Yes Historical Provider, MD  insulin NPH-regular Human (NOVOLIN 70/30)  (70-30) 100 UNIT/ML injection Inject 20-30 Units into the skin See admin instructions. 30 units in am and 20 units in pm   Yes Historical Provider, MD  metoprolol tartrate (LOPRESSOR) 12.5 mg TABS Take 0.5 tablets (12.5 mg total) by mouth 2 (two) times daily. Patient taking differently: Take 25 mg by mouth 2 (two) times daily.  07/10/12  Yes Kathlen Mody, MD  mometasone (ELOCON) 0.1 % cream Apply 1 application topically daily.   Yes Historical Provider, MD  omeprazole (PRILOSEC) 20 MG capsule Take 20 mg by mouth daily.   Yes Historical Provider, MD  polyethylene glycol (MIRALAX / GLYCOLAX) packet Take 17 g by mouth daily as needed for mild constipation. Alternates with colace po   Yes Historical Provider, MD  traMADol (ULTRAM) 50 MG tablet Take 50 mg by mouth at bedtime as needed.   Yes Historical Provider, MD  vitamin B-12 (CYANOCOBALAMIN) 500 MCG tablet Take 250 mcg by mouth daily.   Yes Historical Provider, MD  atorvastatin (LIPITOR) 40 MG tablet Take 1 tablet (40 mg total) by mouth daily at 6 PM. 07/10/12 07/10/13  Kathlen Mody, MD  lisinopril (PRINIVIL,ZESTRIL) 2.5 MG tablet Take 1 tablet (2.5 mg total) by mouth daily. Patient taking differently: Take 5 mg by mouth daily.  07/10/12 07/10/13  Kathlen Mody, MD     Allergies:     Allergies  Allergen Reactions  . Novocain [Procaine Hcl] Other (See Comments)    Pt. passed out  . Penicillins Hives  . Sulfa Antibiotics Nausea And Vomiting     Physical Exam:   Vitals  Blood pressure 155/69, pulse 64, temperature 97.7 F (36.5 C), temperature source Oral, resp. rate 15, weight 74.8 kg (165 lb), SpO2 96 %.  Physical Examination: General appearance - alert, well appearing, and in no distress  Mental status - alert, intermittently confused , easy to reorient, at times she is appropriate and coherent and carries on a full conversation EArs-hard of hearing Eyes - sclera anicteric, legally blind Neck - supple, no JVD elevation , Chest - clear   to auscultation bilaterally, symmetrical air movement,  Heart - S1 and S2 normal,  Abdomen - soft, nontender, nondistended, no masses or organomegaly, no CVA area tenderness Neurological - at baseline patient is not ambulatory due to gait concerns, DTR's normal and symmetric Extremities - uses braces/splints, intact peripheral pulses  Skin - warm, dry    Data Review:    CBC  Recent Labs Lab 06/18/16 1102  WBC 5.2  HGB 13.8  HCT 41.1  PLT 134*  MCV  92.2  MCH 30.9  MCHC 33.6  RDW 13.2  LYMPHSABS 0.9  MONOABS 0.5  EOSABS 0.0  BASOSABS 0.0   ------------------------------------------------------------------------------------------------------------------  Chemistries   Recent Labs Lab 06/18/16 1102  NA 132*  K 3.7  CL 96*  CO2 28  GLUCOSE 277*  BUN 18  CREATININE 1.14*  CALCIUM 8.9  AST 25  ALT 16  ALKPHOS 79  BILITOT 1.5*   ------------------------------------------------------------------------------------------------------------------ CrCl cannot be calculated (Unknown ideal weight.). ------------------------------------------------------------------------------------------------------------------ No results for input(s): TSH, T4TOTAL, T3FREE, THYROIDAB in the last 72 hours.  Invalid input(s): FREET3   Coagulation profile No results for input(s): INR, PROTIME in the last 168 hours. ------------------------------------------------------------------------------------------------------------------- No results for input(s): DDIMER in the last 72 hours. -------------------------------------------------------------------------------------------------------------------  Cardiac Enzymes No results for input(s): CKMB, TROPONINI, MYOGLOBIN in the last 168 hours.  Invalid input(s): CK ------------------------------------------------------------------------------------------------------------------ No results found for:  BNP   ---------------------------------------------------------------------------------------------------------------  Urinalysis    Component Value Date/Time   COLORURINE YELLOW 06/18/2016 1549   APPEARANCEUR CLOUDY (A) 06/18/2016 1549   LABSPEC 1.014 06/18/2016 1549   PHURINE 6.5 06/18/2016 1549   GLUCOSEU 100 (A) 06/18/2016 1549   HGBUR SMALL (A) 06/18/2016 1549   BILIRUBINUR NEGATIVE 06/18/2016 1549   KETONESUR NEGATIVE 06/18/2016 1549   PROTEINUR NEGATIVE 06/18/2016 1549   UROBILINOGEN 1.0 07/06/2012 1416   NITRITE NEGATIVE 06/18/2016 1549   LEUKOCYTESUR MODERATE (A) 06/18/2016 1549    ----------------------------------------------------------------------------------------------------------------   Imaging Results:    Dg Chest 2 View  Result Date: 06/18/2016 CLINICAL DATA:  Shortness of breath.  Sepsis. EXAM: CHEST  2 VIEW COMPARISON:  07/04/2012 FINDINGS: Normal heart size. There is no pleural effusion or edema. No airspace consolidation. Coarsened interstitial markings are present within the lungs. IMPRESSION: 1. No acute findings. 2. Chronic interstitial coarsening noted. Electronically Signed   By: Signa Kell M.D.   On: 06/18/2016 12:01   Ct Head Wo Contrast  Result Date: 06/18/2016 CLINICAL DATA:  Confusion. EXAM: CT HEAD WITHOUT CONTRAST TECHNIQUE: Contiguous axial images were obtained from the base of the skull through the vertex without intravenous contrast. COMPARISON:  August 04, 2011 FINDINGS: There is fluid in the right mastoid air cells with no bony erosion or overlying soft tissue swelling. The middle ears, left mastoid air cells, and paranasal sinuses are normal. Bones are otherwise unremarkable. Postoperative change in the left orbit. Extracranial soft tissues are otherwise normal. No subdural, epidural, or subarachnoid hemorrhage. Ventricles and sulci are unchanged. Stable mild to moderate white matter changes. No acute cortical ischemia or infarct.  Cerebellum, brainstem, and basal cisterns are normal. No mass, mass effect, or midline shift. IMPRESSION: 1. Fluid in the right mastoid air cells with no bony erosion or overlying soft tissue swelling. 2. No other acute abnormalities identified. Electronically Signed   By: Gerome Sam III M.D   On: 06/18/2016 15:50    Radiological Exams on Admission: Dg Chest 2 View  Result Date: 06/18/2016 CLINICAL DATA:  Shortness of breath.  Sepsis. EXAM: CHEST  2 VIEW COMPARISON:  07/04/2012 FINDINGS: Normal heart size. There is no pleural effusion or edema. No airspace consolidation. Coarsened interstitial markings are present within the lungs. IMPRESSION: 1. No acute findings. 2. Chronic interstitial coarsening noted. Electronically Signed   By: Signa Kell M.D.   On: 06/18/2016 12:01   Ct Head Wo Contrast  Result Date: 06/18/2016 CLINICAL DATA:  Confusion. EXAM: CT HEAD WITHOUT CONTRAST TECHNIQUE: Contiguous axial images were obtained from the base of the skull through the vertex without intravenous contrast.  COMPARISON:  August 04, 2011 FINDINGS: There is fluid in the right mastoid air cells with no bony erosion or overlying soft tissue swelling. The middle ears, left mastoid air cells, and paranasal sinuses are normal. Bones are otherwise unremarkable. Postoperative change in the left orbit. Extracranial soft tissues are otherwise normal. No subdural, epidural, or subarachnoid hemorrhage. Ventricles and sulci are unchanged. Stable mild to moderate white matter changes. No acute cortical ischemia or infarct. Cerebellum, brainstem, and basal cisterns are normal. No mass, mass effect, or midline shift. IMPRESSION: 1. Fluid in the right mastoid air cells with no bony erosion or overlying soft tissue swelling. 2. No other acute abnormalities identified. Electronically Signed   By: Gerome Sam III M.D   On: 06/18/2016 15:50    DVT Prophylaxis scd  AM Labs Ordered, also please review Full  Orders  Family Communication: Admission, patients condition and plan of care including tests being ordered have been discussed with the patient and daughter  who indicate understanding and agree with the plan   Code Status - Full Code  Likely DC to  Home   Condition   stableMariea Clonts, Toby Ayad M.D on 06/18/2016 at 6:36 PM   Between 7am to 7pm - Pager - [phone]  After 7pm go to [email or web address] - password [email or web address]  [facility] - Office  [phone]  Dragon dictation system was used to create this note, attempts have been made to correct errors, however presence of uncorrected errors is not a reflection quality of care provided.

## 2016-06-18 NOTE — ED Triage Notes (Signed)
Pt here with hx of UTI --dx on 8/2-- was given antibiotics IV and taking cipro po, but states that pt has been confused, increasingly at night, having "Illusions" seeing things every time pt has UTI.

## 2016-06-18 NOTE — ED Notes (Signed)
Pt attempting to urinate in bedpan,

## 2016-06-18 NOTE — ED Notes (Signed)
Report called to Nicki, RN at this time.  Receiving nurse denies having any further questions at this time.   

## 2016-06-19 DIAGNOSIS — Z7982 Long term (current) use of aspirin: Secondary | ICD-10-CM | POA: Diagnosis not present

## 2016-06-19 DIAGNOSIS — H540 Blindness, both eyes: Secondary | ICD-10-CM | POA: Diagnosis present

## 2016-06-19 DIAGNOSIS — H9193 Unspecified hearing loss, bilateral: Secondary | ICD-10-CM | POA: Diagnosis present

## 2016-06-19 DIAGNOSIS — H109 Unspecified conjunctivitis: Secondary | ICD-10-CM | POA: Diagnosis present

## 2016-06-19 DIAGNOSIS — Z881 Allergy status to other antibiotic agents status: Secondary | ICD-10-CM | POA: Diagnosis not present

## 2016-06-19 DIAGNOSIS — F05 Delirium due to known physiological condition: Secondary | ICD-10-CM | POA: Diagnosis present

## 2016-06-19 DIAGNOSIS — E119 Type 2 diabetes mellitus without complications: Secondary | ICD-10-CM | POA: Diagnosis present

## 2016-06-19 DIAGNOSIS — E876 Hypokalemia: Secondary | ICD-10-CM | POA: Diagnosis present

## 2016-06-19 DIAGNOSIS — Z79899 Other long term (current) drug therapy: Secondary | ICD-10-CM | POA: Diagnosis not present

## 2016-06-19 DIAGNOSIS — N390 Urinary tract infection, site not specified: Secondary | ICD-10-CM | POA: Diagnosis present

## 2016-06-19 DIAGNOSIS — R441 Visual hallucinations: Secondary | ICD-10-CM | POA: Diagnosis present

## 2016-06-19 DIAGNOSIS — G9341 Metabolic encephalopathy: Secondary | ICD-10-CM | POA: Diagnosis present

## 2016-06-19 DIAGNOSIS — Z888 Allergy status to other drugs, medicaments and biological substances status: Secondary | ICD-10-CM | POA: Diagnosis not present

## 2016-06-19 DIAGNOSIS — I10 Essential (primary) hypertension: Secondary | ICD-10-CM | POA: Diagnosis present

## 2016-06-19 DIAGNOSIS — L308 Other specified dermatitis: Secondary | ICD-10-CM | POA: Diagnosis present

## 2016-06-19 DIAGNOSIS — G9340 Encephalopathy, unspecified: Secondary | ICD-10-CM | POA: Diagnosis present

## 2016-06-19 DIAGNOSIS — F4489 Other dissociative and conversion disorders: Secondary | ICD-10-CM | POA: Diagnosis not present

## 2016-06-19 DIAGNOSIS — R410 Disorientation, unspecified: Secondary | ICD-10-CM | POA: Diagnosis present

## 2016-06-19 DIAGNOSIS — Z880 Allergy status to penicillin: Secondary | ICD-10-CM | POA: Diagnosis not present

## 2016-06-19 DIAGNOSIS — E872 Acidosis: Secondary | ICD-10-CM | POA: Diagnosis present

## 2016-06-19 DIAGNOSIS — Z794 Long term (current) use of insulin: Secondary | ICD-10-CM | POA: Diagnosis not present

## 2016-06-19 LAB — CBC
HEMATOCRIT: 37.9 % (ref 36.0–46.0)
HEMOGLOBIN: 12.4 g/dL (ref 12.0–15.0)
MCH: 30.2 pg (ref 26.0–34.0)
MCHC: 32.7 g/dL (ref 30.0–36.0)
MCV: 92.4 fL (ref 78.0–100.0)
Platelets: 108 10*3/uL — ABNORMAL LOW (ref 150–400)
RBC: 4.1 MIL/uL (ref 3.87–5.11)
RDW: 13.3 % (ref 11.5–15.5)
WBC: 3.7 10*3/uL — ABNORMAL LOW (ref 4.0–10.5)

## 2016-06-19 LAB — BASIC METABOLIC PANEL
ANION GAP: 6 (ref 5–15)
BUN: 10 mg/dL (ref 6–20)
CHLORIDE: 103 mmol/L (ref 101–111)
CO2: 28 mmol/L (ref 22–32)
Calcium: 8.3 mg/dL — ABNORMAL LOW (ref 8.9–10.3)
Creatinine, Ser: 0.86 mg/dL (ref 0.44–1.00)
GFR calc non Af Amer: 56 mL/min — ABNORMAL LOW (ref >60)
Glucose, Bld: 238 mg/dL — ABNORMAL HIGH (ref 65–99)
POTASSIUM: 3.2 mmol/L — AB (ref 3.5–5.1)
Sodium: 137 mmol/L (ref 135–145)

## 2016-06-19 LAB — GLUCOSE, CAPILLARY
GLUCOSE-CAPILLARY: 340 mg/dL — AB (ref 65–99)
GLUCOSE-CAPILLARY: 357 mg/dL — AB (ref 65–99)
GLUCOSE-CAPILLARY: 317 mg/dL — AB (ref 65–99)
GLUCOSE-CAPILLARY: 193 mg/dL — AB (ref 65–99)
Glucose-Capillary: 225 mg/dL — ABNORMAL HIGH (ref 65–99)

## 2016-06-19 LAB — URINE CULTURE

## 2016-06-19 LAB — LACTIC ACID, PLASMA: Lactic Acid, Venous: 0.9 mmol/L (ref 0.5–1.9)

## 2016-06-19 MED ORDER — CIPROFLOXACIN HCL 0.3 % OP SOLN
1.0000 [drp] | OPHTHALMIC | Status: DC
  Administered 2016-06-19 – 2016-06-22 (×17): 1 [drp] via OPHTHALMIC
  Filled 2016-06-19: qty 2.5

## 2016-06-19 MED ORDER — INSULIN GLARGINE 100 UNIT/ML ~~LOC~~ SOLN
15.0000 [IU] | Freq: Every day | SUBCUTANEOUS | Status: DC
  Administered 2016-06-19 – 2016-06-20 (×2): 15 [IU] via SUBCUTANEOUS
  Filled 2016-06-19 (×3): qty 0.15

## 2016-06-19 MED ORDER — INSULIN ASPART 100 UNIT/ML ~~LOC~~ SOLN
4.0000 [IU] | Freq: Three times a day (TID) | SUBCUTANEOUS | Status: DC
  Administered 2016-06-19 – 2016-06-22 (×9): 4 [IU] via SUBCUTANEOUS

## 2016-06-19 MED ORDER — INSULIN GLARGINE 100 UNIT/ML ~~LOC~~ SOLN
20.0000 [IU] | Freq: Every day | SUBCUTANEOUS | Status: DC
  Administered 2016-06-19 – 2016-06-20 (×2): 20 [IU] via SUBCUTANEOUS
  Filled 2016-06-19 (×2): qty 0.2

## 2016-06-19 MED ORDER — POTASSIUM CHLORIDE CRYS ER 20 MEQ PO TBCR
40.0000 meq | EXTENDED_RELEASE_TABLET | Freq: Once | ORAL | Status: AC
  Administered 2016-06-19: 40 meq via ORAL
  Filled 2016-06-19: qty 2

## 2016-06-19 MED ORDER — HALOPERIDOL 0.5 MG PO TABS
0.5000 mg | ORAL_TABLET | Freq: Four times a day (QID) | ORAL | Status: DC | PRN
  Filled 2016-06-19: qty 1

## 2016-06-19 NOTE — Progress Notes (Signed)
PROGRESS NOTE    Julia LernerKathleen Oliver  RUE:[id number] DOB: 14-Jul-1920 DOA: 06/18/2016 PCP: Default, Provider, MD    Brief Narrative: Julia Oliver  is a 82 y.o. female, With past medical history relevant for hypertension diabetes and vision loss who presents with family with concerns about worsening delirium/confusional episodes with visual hallucinations. [facility] on [facility]2016 diagnosed with a UTI and started on Cipro, she has been compliant with Cipro but confusional episodes with visual hallucinations have gotten worse, no fevers no vomiting no diarrhea no flank pain no abdominal pain. Family members at bedside, questions answered   Assessment & Plan:   Principal Problem:   Confusion state Active Problems:   HTN (hypertension)   Diabetes mellitus, type II (HCC)   Confusion  Metabolic encephalopathy/delirium  secondary to UTI, more calm this morning.  Continue with treatment of infection. PT consult.  CT head negative.   Lactic acidosis; improved with fluids.   UTI; UA with 6-30 WBC.  Follow urine culture, although patient was already on antibiotics.  Follow blood culture.  Continue with ceftriaxone.   Hypokalemia; replace orally.   DM- will change 70/30 to lantus to avoid hypoglycemia, SSI   HTN-stable, continue metoprolol, lisinopril, hold Lasix  Bilateral blindness;   Conjunctivitis; cipro otic.    DVT prophylaxis: Heparin  Code Status: Full Code.  Family Communication: Disposition Plan: remain inpatient , might need SNF   Consultants:   None  Procedures:   none   Antimicrobials:   Ceftriaxone 8-06   Subjective: Patient alert, answer questions. Denies pain, appear more calm,   Objective: Vitals:   06/18/16 1900 06/18/16 1930 06/18/16 2058 06/19/16 0605  BP: 163/86 112/61 124/63 (!) 141/57  Pulse: 84 84 80 64  Resp: 19 18  16   Temp:  98.4 F (36.9 C) 97.8 F (36.6 C) 98.1 F (36.7 C)  TempSrc:  Oral  Oral Oral  SpO2: 98% 98% 98% 98%  Weight:       No intake or output data in the 24 hours ending 06/19/16 0736 Filed Weights   06/18/16 1600  Weight: 74.8 kg (165 lb)    Examination:  General exam: Appears calm and comfortable  Respiratory system: Clear to auscultation. Respiratory effort normal. Cardiovascular system: S1 & S2 heard, RRR. No JVD, murmurs, rubs, gallops or clicks. No pedal edema. Gastrointestinal system: Abdomen is nondistended, soft and nontender. No organomegaly or masses felt. Normal bowel sounds heard. Central nervous system: Alert and oriented. No focal neurological deficits. Extremities: Symmetric 5 x 5 power. Skin: No rashes, lesions or ulcers     Data Reviewed: I have personally reviewed following labs and imaging studies  CBC:  Recent Labs Lab 06/18/16 1102  WBC 5.2  NEUTROABS 3.8  HGB 13.8  HCT 41.1  MCV 92.2  PLT 134*   Basic Metabolic Panel:  Recent Labs Lab 06/18/16 1102  NA 132*  K 3.7  CL 96*  CO2 28  GLUCOSE 277*  BUN 18  CREATININE 1.14*  CALCIUM 8.9   GFR: CrCl cannot be calculated (Unknown ideal weight.). Liver Function Tests:  Recent Labs Lab 06/18/16 1102  AST 25  ALT 16  ALKPHOS 79  BILITOT 1.5*  PROT 7.3  ALBUMIN 3.6   No results for input(s): LIPASE, AMYLASE in the last 168 hours. No results for input(s): AMMONIA in the last 168 hours. Coagulation Profile: No results for input(s): INR, PROTIME in the last 168 hours. Cardiac Enzymes: No results for input(s): CKTOTAL, CKMB, CKMBINDEX,  TROPONINI in the last 168 hours. BNP (last 3 results) No results for input(s): PROBNP in the last 8760 hours. HbA1C: No results for input(s): HGBA1C in the last 72 hours. CBG:  Recent Labs Lab 06/18/16 2059  GLUCAP 224*   Lipid Profile: No results for input(s): CHOL, HDL, LDLCALC, TRIG, CHOLHDL, LDLDIRECT in the last 72 hours. Thyroid Function Tests: No results for input(s): TSH, T4TOTAL, FREET4, T3FREE, THYROIDAB  in the last 72 hours. Anemia Panel: No results for input(s): VITAMINB12, FOLATE, FERRITIN, TIBC, IRON, RETICCTPCT in the last 72 hours. Sepsis Labs:  Recent Labs Lab 06/18/16 1117 06/18/16 1425 06/19/16 0025  LATICACIDVEN 3.65* 3.00* 0.9    No results found for this or any previous visit (from the past 240 hour(s)).       Radiology Studies: Dg Chest 2 View  Result Date: 06/18/2016 CLINICAL DATA:  Shortness of breath.  Sepsis. EXAM: CHEST  2 VIEW COMPARISON:  07/04/2012 FINDINGS: Normal heart size. There is no pleural effusion or edema. No airspace consolidation. Coarsened interstitial markings are present within the lungs. IMPRESSION: 1. No acute findings. 2. Chronic interstitial coarsening noted. Electronically Signed   By: Signa Kell M.D.   On: 06/18/2016 12:01   Ct Head Wo Contrast  Result Date: 06/18/2016 CLINICAL DATA:  Confusion. EXAM: CT HEAD WITHOUT CONTRAST TECHNIQUE: Contiguous axial images were obtained from the base of the skull through the vertex without intravenous contrast. COMPARISON:  August 04, 2011 FINDINGS: There is fluid in the right mastoid air cells with no bony erosion or overlying soft tissue swelling. The middle ears, left mastoid air cells, and paranasal sinuses are normal. Bones are otherwise unremarkable. Postoperative change in the left orbit. Extracranial soft tissues are otherwise normal. No subdural, epidural, or subarachnoid hemorrhage. Ventricles and sulci are unchanged. Stable mild to moderate white matter changes. No acute cortical ischemia or infarct. Cerebellum, brainstem, and basal cisterns are normal. No mass, mass effect, or midline shift. IMPRESSION: 1. Fluid in the right mastoid air cells with no bony erosion or overlying soft tissue swelling. 2. No other acute abnormalities identified. Electronically Signed   By: Gerome Sam III M.D   On: 06/18/2016 15:50        Scheduled Meds: . antiseptic oral rinse  7 mL Mouth Rinse BID  .  aspirin EC  81 mg Oral Daily  . atorvastatin  10 mg Oral Daily  . cefTRIAXone (ROCEPHIN)  IV  1 g Intravenous Q24H  . heparin  5,000 Units Subcutaneous Q8H  . insulin aspart  0-9 Units Subcutaneous TID WC  . insulin aspart protamine- aspart  20 Units Subcutaneous Q supper  . insulin aspart protamine- aspart  30 Units Subcutaneous Q breakfast  . lisinopril  5 mg Oral Daily  . metoprolol tartrate  25 mg Oral BID  . pantoprazole  40 mg Oral Daily  . polycarbophil  625 mg Oral Daily  . senna  1 tablet Oral BID  . sodium chloride  1,000 mL Intravenous Once  . sodium chloride flush  3 mL Intravenous Q12H   Continuous Infusions: . sodium chloride 75 mL/hr at 06/18/16 2101     LOS: 0 days    Time spent: 35 minutes    Alba Cory, MD [facility] Pager [phone]  If 7PM-7AM, please contact night-coverage [email or web address] Password [facility] 06/19/2016, 7:36 AM

## 2016-06-20 LAB — GLUCOSE, CAPILLARY
GLUCOSE-CAPILLARY: 82 mg/dL (ref 65–99)
Glucose-Capillary: 130 mg/dL — ABNORMAL HIGH (ref 65–99)
Glucose-Capillary: 177 mg/dL — ABNORMAL HIGH (ref 65–99)
Glucose-Capillary: 142 mg/dL — ABNORMAL HIGH (ref 65–99)

## 2016-06-20 MED ORDER — INSULIN GLARGINE 100 UNIT/ML ~~LOC~~ SOLN
22.0000 [IU] | Freq: Every day | SUBCUTANEOUS | Status: DC
  Administered 2016-06-21 – 2016-06-22 (×2): 22 [IU] via SUBCUTANEOUS
  Filled 2016-06-20 (×2): qty 0.22

## 2016-06-20 MED ORDER — FUROSEMIDE 40 MG PO TABS
40.0000 mg | ORAL_TABLET | Freq: Every day | ORAL | Status: DC
  Administered 2016-06-20 – 2016-06-22 (×3): 40 mg via ORAL
  Filled 2016-06-20 (×3): qty 1

## 2016-06-20 MED ORDER — POTASSIUM CHLORIDE CRYS ER 20 MEQ PO TBCR
40.0000 meq | EXTENDED_RELEASE_TABLET | Freq: Once | ORAL | Status: AC
Start: 2016-06-20 — End: 2016-06-20
  Administered 2016-06-20: 40 meq via ORAL
  Filled 2016-06-20: qty 2

## 2016-06-20 MED ORDER — AMMONIUM LACTATE 12 % EX LOTN
TOPICAL_LOTION | Freq: Every day | CUTANEOUS | Status: DC
  Administered 2016-06-20: 14:00:00 via TOPICAL
  Administered 2016-06-21: 1 via TOPICAL
  Administered 2016-06-22: 10:00:00 via TOPICAL
  Filled 2016-06-20: qty 400

## 2016-06-20 MED ORDER — MUPIROCIN CALCIUM 2 % EX CREA
TOPICAL_CREAM | Freq: Every day | CUTANEOUS | Status: DC
  Administered 2016-06-20: 14:00:00 via TOPICAL
  Administered 2016-06-21: 1 via TOPICAL
  Administered 2016-06-22: 10:00:00 via TOPICAL
  Filled 2016-06-20: qty 15

## 2016-06-20 NOTE — Progress Notes (Signed)
Inpatient Diabetes Program Recommendations  AACE/ADA: New Consensus Statement on Inpatient Glycemic Control (2015)  Target Ranges:  Prepandial:   less than 140 mg/dL      Peak postprandial:   less than 180 mg/dL (1-2 hours)      Critically ill patients:  140 - 180 mg/dL   Results for Campbell LernerCHISON, Danean (MRN [id number]) as of 06/20/2016 11:46  Ref. Range 06/19/2016 07:36 06/19/2016 12:53 06/19/2016 12:55 06/19/2016 17:04 06/19/2016 21:17 06/20/2016 07:53  Glucose-Capillary Latest Ref Range: 65 - 99 mg/dL 952225 (H) 841357 (H) 324340 (H) 317 (H) 193 (H) 130 (H)   Review of Glycemic Control  Outpatient Diabetes medications: 70/30 30 units QAM, 70/30 20 units QPM Current orders for Inpatient glycemic control: Lantus 20 units QAM, Lantus 15 units QHS, Novolog 4 units TID with meals, Novolog 4 units TID with meals for meal coverage  Inpatient Diabetes Program Recommendations: Insulin - Basal: Please consider increasing morning dose of Lantus to 24 units QAM. Insulin-Correction: Please consider ordering Novolog bedtime correction scale.  Thanks, Orlando PennerMarie Zala Degrasse, RN, MSN, CDE Diabetes Coordinator Inpatient Diabetes Program [phone] (Team Pager from 8am to 5pm) [phone] ([facility] office) [phone] [facility] office) [phone] [facility] office)

## 2016-06-20 NOTE — Consult Note (Signed)
WOC Nurse wound consult note Reason for Consult: Chronic skin changes.  Daughter at bedside and states she applies OTC Hydrocortisone cream for Eczema to legs daily.   Trauma wound to right anterior lower leg.  Daughter thinks she bumped into something.  Wound type:Trauma to right anterior leg. Chronic eczema, dry skin to bilateral lower legs.  Pressure Ulcer POA: N/A Measurement:Right lower leg:  2 cm x 1 cm x 0.2 cm  Wound ONG:EXBMbed:pink and moist Drainage (amount, consistency, odor) Minimal serosanguinous Periwound:Dry, cracked skin Dressing procedure/placement/frequency:Cleanse bilateral lower legs with soap and water.  Apply Bactroban cream to trauma wound on right lower leg.   Amlactin cream to bilateral lower legs, dry skin.  Will not follow at this time.  Please re-consult if needed.  Julia HudsonKaren Bernardine Langworthy RN BSN [facility]ON Pager [phone]

## 2016-06-20 NOTE — [place] FL2 (Signed)
[place] MEDICAID FL2 LEVEL OF CARE SCREENING TOOL     IDENTIFICATION  Patient Name: Julia Oliver Birthdate: May 31, 1920 Sex: female Admission Date (Current Location): 06/18/2016  [facility] [place]   Facility and Address:  [facility], [address], [place], [place] [address]      Provider Number: [id number]  Attending Physician Name and Address:  Alba CoryBelkys A Regalado, MD  Relative Name and Phone Number:  Clydie BraunKaren, daughter, [phone]    Current Level of Care: Hospital Recommended Level of Care: Skilled Nursing Facility Prior Approval Number:    Date Approved/Denied:   PASRR Number: [id number]  Discharge Plan: SNF    Current Diagnoses: Patient Active Problem List   Diagnosis Date Noted  . Encephalopathy 06/19/2016  . Confusion state 06/18/2016  . Confusion 06/18/2016  . Acute coronary syndrome (HCC) 07/04/2012  . HTN (hypertension) 07/04/2012  . Diabetes mellitus, type II (HCC) 07/04/2012  . Hyponatremia 07/04/2012    Orientation RESPIRATION BLADDER Height & Weight     Self, Place, Time  Normal Incontinent Weight: 74.8 kg (165 lb) Height:     BEHAVIORAL SYMPTOMS/MOOD NEUROLOGICAL BOWEL NUTRITION STATUS      Incontinent Diet (Please see DC Summary)  AMBULATORY STATUS COMMUNICATION OF NEEDS Skin   Extensive Assist Verbally Normal                       Personal Care Assistance Level of Assistance  Bathing, Feeding, Dressing Bathing Assistance: Maximum assistance Feeding assistance: Limited assistance Dressing Assistance: Maximum assistance     Functional Limitations Info             SPECIAL CARE FACTORS FREQUENCY  PT (By licensed PT)     PT Frequency: 5x/week              Contractures      Additional Factors Info  Code Status, Allergies, Insulin Sliding Scale Code Status Info: Full Allergies Info: Novocain Procaine Hcl, Penicillins, Sulfa Antibiotics   Insulin Sliding Scale Info:  insulin aspart (novoLOG) injection 0-9 Units;insulin aspart (novoLOG) injection 4 Units;insulin glargine (LANTUS) injection 15 Units       Current Medications (06/20/2016):  This is the current hospital active medication list Current Facility-Administered Medications  Medication Dose Route Frequency Provider Last Rate Last Dose  . 0.9 %  sodium chloride infusion  250 mL Intravenous PRN Courage Emokpae, MD      . 0.9 %  sodium chloride infusion   Intravenous Continuous Shon Haleourage Emokpae, MD 75 mL/hr at 06/19/16 2356    . acetaminophen (TYLENOL) tablet 650 mg  650 mg Oral Q6H PRN Shon Haleourage Emokpae, MD       Or  . acetaminophen (TYLENOL) suppository 650 mg  650 mg Rectal Q6H PRN Courage Emokpae, MD      . albuterol (PROVENTIL) (2.5 MG/3ML) 0.083% nebulizer solution 2.5 mg  2.5 mg Nebulization Q2H PRN Shon Haleourage Emokpae, MD      . antiseptic oral rinse (CPC / CETYLPYRIDINIUM CHLORIDE 0.05%) solution 7 mL  7 mL Mouth Rinse BID Shon Haleourage Emokpae, MD   7 mL at 06/20/16 0855  . aspirin EC tablet 81 mg  81 mg Oral Daily Shon Haleourage Emokpae, MD   81 mg at 06/20/16 0853  . atorvastatin (LIPITOR) tablet 10 mg  10 mg Oral Daily Shon Haleourage Emokpae, MD   10 mg at 06/20/16 0854  . cefTRIAXone (ROCEPHIN) 1 g in dextrose 5 % 50 mL IVPB  1 g Intravenous Q24H Courage  Emokpae, MD   1 g at 06/19/16 1716  . ciprofloxacin (CILOXAN) 0.3 % ophthalmic solution 1 drop  1 drop Both Eyes Q4H while awake Belkys A Regalado, MD   1 drop at 06/20/16 0858  . docusate sodium (COLACE) capsule 100 mg  100 mg Oral BID PRN Shon Hale, MD      . haloperidol (HALDOL) tablet 0.5 mg  0.5 mg Oral Q6H PRN Belkys A Regalado, MD      . heparin injection 5,000 Units  5,000 Units Subcutaneous Q8H Shon Hale, MD   5,000 Units at 06/20/16 0554  . insulin aspart (novoLOG) injection 0-9 Units  0-9 Units Subcutaneous TID WC Shon Hale, MD   1 Units at 06/20/16 0851  . insulin aspart (novoLOG) injection 4 Units  4 Units Subcutaneous TID WC Belkys A  Regalado, MD   4 Units at 06/20/16 0851  . insulin glargine (LANTUS) injection 15 Units  15 Units Subcutaneous QHS Belkys A Regalado, MD   15 Units at 06/19/16 2309  . insulin glargine (LANTUS) injection 20 Units  20 Units Subcutaneous Daily Belkys A Regalado, MD   20 Units at 06/20/16 0856  . lisinopril (PRINIVIL,ZESTRIL) tablet 5 mg  5 mg Oral Daily Shon Hale, MD   5 mg at 06/20/16 0854  . metoprolol tartrate (LOPRESSOR) tablet 25 mg  25 mg Oral BID Shon Hale, MD   25 mg at 06/20/16 0854  . ondansetron (ZOFRAN) tablet 4 mg  4 mg Oral Q6H PRN Shon Hale, MD       Or  . ondansetron (ZOFRAN) injection 4 mg  4 mg Intravenous Q6H PRN Courage Emokpae, MD      . pantoprazole (PROTONIX) EC tablet 40 mg  40 mg Oral Daily Shon Hale, MD   40 mg at 06/20/16 0853  . polycarbophil (FIBERCON) tablet 625 mg  625 mg Oral Daily Shon Hale, MD   625 mg at 06/20/16 0855  . polyethylene glycol (MIRALAX / GLYCOLAX) packet 17 g  17 g Oral Daily PRN Shon Hale, MD      . senna (SENOKOT) tablet 8.6 mg  1 tablet Oral BID Shon Hale, MD   8.6 mg at 06/20/16 0853  . sodium chloride 0.9 % bolus 1,000 mL  1,000 mL Intravenous Once Courage Emokpae, MD      . sodium chloride flush (NS) 0.9 % injection 3 mL  3 mL Intravenous Q12H Shon Hale, MD   3 mL at 06/19/16 1103  . sodium chloride flush (NS) 0.9 % injection 3 mL  3 mL Intravenous PRN Shon Hale, MD      . traZODone (DESYREL) tablet 50 mg  50 mg Oral QHS PRN Shon Hale, MD   50 mg at 06/18/16 2148     Discharge Medications: Please see discharge summary for a list of discharge medications.  Relevant Imaging Results:  Relevant Lab Results:   Additional Information SSN: [id number] [place], [place]

## 2016-06-20 NOTE — Evaluation (Signed)
Physical Therapy Evaluation Patient Details Name: Julia Oliver MRN: [id number] DOB: 07-28-1920 Today's Date: 06/20/2016   History of Present Illness  82 yo female with onset of confusion from UTI, metabolic encephalopathy, lactic acidosis with resolution, hypokalemia.  PMHx:  B blindness, DM, HTN  Clinical Impression  Pt is up to bedside with limited ability to move, mainly is weak and cannot get pt to attempt standing as she feels unsafe trying it.  Asked CNA to help her later, and hopefully will be able to sit up and work on regaining strength.  PT will follow acutely to work on transfers and LE ROM to ankles and strengthening.    Follow Up Recommendations SNF    Equipment Recommendations  None recommended by PT (await disposition from SNF)    Recommendations for Other Services Rehab consult     Precautions / Restrictions Precautions Precautions: Fall Precaution Comments: B blindness, speak to pt at door to alert her Restrictions Weight Bearing Restrictions: No Other Position/Activity Restrictions: has pain in B legs      Mobility  Bed Mobility Overal bed mobility: Needs Assistance Bed Mobility: Supine to Sit;Sit to Supine     Supine to sit: Min assist;Mod assist Sit to supine: Min assist;Mod assist   General bed mobility comments: needed cues for all sequence and tends to move as a unit with no flexibility in trunk  Transfers Overall transfer level:  (declined to try)               General transfer comment: Pt states she needs a great deal of help at home  Ambulation/Gait             General Gait Details: declined to attempt  Stairs            Wheelchair Mobility    Modified Rankin (Stroke Patients Only)       Balance Overall balance assessment: Needs assistance Sitting-balance support: Feet supported Sitting balance-Leahy Scale: Fair Sitting balance - Comments: fair once set                                      Pertinent Vitals/Pain Pain Assessment: Faces Faces Pain Scale: Hurts even more Pain Location: R ant lower leg, L foot Pain Descriptors / Indicators: Aching;Sore Pain Intervention(s): Monitored during session;Limited activity within patient's tolerance;Repositioned    Home Living Family/patient expects to be discharged to:: Private residence Living Arrangements: Children Available Help at Discharge: Family;Available 24 hours/day Type of Home: House Home Access: Other (comment) (could not remember)       Home Equipment: Other (comment);Wheelchair - manual (Pt reports a walker but cannot describe) Additional Comments: Confused and no family in to help determine needs    Prior Function Level of Independence: Needs assistance   Gait / Transfers Assistance Needed: 1-2 person assist chair to chair on walker  ADL's / Homemaking Assistance Needed: lives with children who care for her        Hand Dominance        Extremity/Trunk Assessment   Upper Extremity Assessment: Generalized weakness           Lower Extremity Assessment: Generalized weakness      Cervical / Trunk Assessment: Kyphotic  Communication   Communication: No difficulties  Cognition Arousal/Alertness: Awake/alert Behavior During Therapy: Flat affect Overall Cognitive Status: No family/caregiver present to determine baseline cognitive functioning       Memory: Decreased  recall of precautions;Decreased short-term memory              General Comments General comments (skin integrity, edema, etc.): PT spoke to nursing about trying to sit up later, as pt demonstrates active LE movement and was standing at home prior to her admission    Exercises        Assessment/Plan    PT Assessment Patient needs continued PT services  PT Diagnosis Generalized weakness;Altered mental status;Other (comment) (decreased vision)   PT Problem List Decreased strength;Decreased range of motion;Decreased  activity tolerance;Decreased balance;Decreased mobility;Decreased coordination;Decreased cognition;Decreased safety awareness;Decreased knowledge of use of DME;Cardiopulmonary status limiting activity  PT Treatment Interventions DME instruction;Gait training;Functional mobility training;Therapeutic activities;Therapeutic exercise;Balance training;Neuromuscular re-education;Patient/family education   PT Goals (Current goals can be found in the Care Plan section) Acute Rehab PT Goals Patient Stated Goal: to get home again PT Goal Formulation: With patient Time For Goal Achievement: 07/04/16 Potential to Achieve Goals: Good    Frequency Min 2X/week   Barriers to discharge Other (comment) (no clear information but son has kept her at home)      Co-evaluation               End of Session Equipment Utilized During Treatment: Oxygen Activity Tolerance: Patient tolerated treatment well;Other (comment) (limited standing due to her fear she could not do it) Patient left: in bed;with call bell/phone within reach;with bed alarm set Nurse Communication: Mobility status;Other (comment) (need for pt to get OOB today)         Time: 1001-1025 PT Time Calculation (min) (ACUTE ONLY): 24 min   Charges:   PT Evaluation $PT Eval Moderate Complexity: 1 Procedure PT Treatments $Therapeutic Activity: 8-22 mins   PT G Codes:        Ivar DrapeStout, Emmons Toth E 06/20/2016, 11:16 AM  Samul Dadauth Windsor Goeken, PT MS Acute Rehab Dept. Number: [facility] [phone] and [facility] [phone]

## 2016-06-20 NOTE — Progress Notes (Signed)
PROGRESS NOTE    Julia Oliver  HQI:[id number] DOB: 27-Sep-1920 DOA: 06/18/2016 PCP: Default, Provider, MD    Brief Narrative: Julia Oliver  is a 82 y.o. female, With past medical history relevant for hypertension diabetes and vision loss who presents with family with concerns about worsening delirium/confusional episodes with visual hallucinations. Patient was seen at [facility] on 06/15/2016 diagnosed with a UTI and started on Cipro, she has been compliant with Cipro but confusional episodes with visual hallucinations have gotten worse, no fevers no vomiting no diarrhea no flank pain no abdominal pain. Family members at bedside, questions answered   Assessment & Plan:   Principal Problem:   Confusion state Active Problems:   HTN (hypertension)   Diabetes mellitus, type II (HCC)   Confusion   Encephalopathy  Metabolic encephalopathy/delirium  secondary to UTI. , more calm this morning.  Continue with treatment of infection. PT consult.  CT head negative.   Lactic acidosis; improved with fluids.   UTI; UA with 6-30 WBC.  Follow urine culture, although patient was already on antibiotics.  Follow blood culture.  Continue with ceftriaxone . Day 2.   Hypokalemia; replace orally.   DM- will change 70/30 to lantus to avoid hypoglycemia, SSI  lantus 22 in am and 15 at bedtime  HTN-stable, continue metoprolol, lisinopril, resume lasix   Bilateral blindness;   Conjunctivitis; cipro otic.    DVT prophylaxis: Heparin  Code Status: Full Code.  Family Communication: daughter at bedside  Disposition Plan: remain inpatient , might need SNF   Consultants:   None  Procedures:   none   Antimicrobials:   Ceftriaxone 8-06   Subjective: Calm this morning. Mild aggiation last night per daughter    Objective: Vitals:   06/19/16 1513 06/19/16 2119 06/19/16 2324 06/20/16 0616  BP: (!) 140/46 (!) 145/45 (!) 146/61 (!) 142/47  Pulse: 63 (!) 59 70 63   Resp: 16 18  18   Temp: 98.3 F (36.8 C) 98.1 F (36.7 C)  98.2 F (36.8 C)  TempSrc: Oral Oral  Oral  SpO2: 97% 99% 98% 98%  Weight:        Intake/Output Summary (Last 24 hours) at 06/20/16 1408 Last data filed at 06/20/16 1027  Gross per 24 hour  Intake           2357.5 ml  Output                0 ml  Net           2357.5 ml   Filed Weights   06/18/16 1600  Weight: 74.8 kg (165 lb)    Examination:  General exam: Appears calm and comfortable  Respiratory system: Clear to auscultation. Respiratory effort normal. Cardiovascular system: S1 & S2 heard, RRR. No JVD, murmurs, rubs, gallops or clicks. No pedal edema. Gastrointestinal system: Abdomen is nondistended, soft and nontender. No organomegaly or masses felt. Normal bowel sounds heard. Central nervous system: Alert and oriented. No focal neurological deficits. Extremities: Symmetric 5 x 5 power. Skin: No rashes, lesions or ulcers     Data Reviewed: I have personally reviewed following labs and imaging studies  CBC:  Recent Labs Lab 06/18/16 1102 06/19/16 0719  WBC 5.2 3.7*  NEUTROABS 3.8  --   HGB 13.8 12.4  HCT 41.1 37.9  MCV 92.2 92.4  PLT 134* 108*   Basic Metabolic Panel:  Recent Labs Lab 06/18/16 1102 06/19/16 0719  NA 132* 137  K 3.7 3.2*  CL 96* 103  CO2 28 28  GLUCOSE 277* 238*  BUN 18 10  CREATININE 1.14* 0.86  CALCIUM 8.9 8.3*   GFR: CrCl cannot be calculated (Unknown ideal weight.). Liver Function Tests:  Recent Labs Lab 06/18/16 1102  AST 25  ALT 16  ALKPHOS 79  BILITOT 1.5*  PROT 7.3  ALBUMIN 3.6   No results for input(s): LIPASE, AMYLASE in the last 168 hours. No results for input(s): AMMONIA in the last 168 hours. Coagulation Profile: No results for input(s): INR, PROTIME in the last 168 hours. Cardiac Enzymes: No results for input(s): CKTOTAL, CKMB, CKMBINDEX, TROPONINI in the last 168 hours. BNP (last 3 results) No results for input(s): PROBNP in the last 8760  hours. HbA1C: No results for input(s): HGBA1C in the last 72 hours. CBG:  Recent Labs Lab 06/19/16 1255 06/19/16 1704 06/19/16 2117 06/20/16 0753 06/20/16 1206  GLUCAP 340* 317* 193* 130* 177*   Lipid Profile: No results for input(s): CHOL, HDL, LDLCALC, TRIG, CHOLHDL, LDLDIRECT in the last 72 hours. Thyroid Function Tests: No results for input(s): TSH, T4TOTAL, FREET4, T3FREE, THYROIDAB in the last 72 hours. Anemia Panel: No results for input(s): VITAMINB12, FOLATE, FERRITIN, TIBC, IRON, RETICCTPCT in the last 72 hours. Sepsis Labs:  Recent Labs Lab 06/18/16 1117 06/18/16 1425 06/19/16 0025  LATICACIDVEN 3.65* 3.00* 0.9    Recent Results (from the past 240 hour(s))  Culture, blood (Routine x 2)     Status: None (Preliminary result)   Collection Time: 06/18/16 11:02 AM  Result Value Ref Range Status   Specimen Description BLOOD RIGHT ANTECUBITAL  Final   Special Requests BOTTLES DRAWN AEROBIC AND ANAEROBIC  5CC  Final   Culture NO GROWTH 1 DAY  Final   Report Status PENDING  Incomplete  Culture, blood (Routine x 2)     Status: None (Preliminary result)   Collection Time: 06/18/16 12:10 PM  Result Value Ref Range Status   Specimen Description BLOOD RIGHT ANTECUBITAL  Final   Special Requests BOTTLES DRAWN AEROBIC AND ANAEROBIC  10CC  Final   Culture NO GROWTH 1 DAY  Final   Report Status PENDING  Incomplete  Urine culture     Status: Abnormal   Collection Time: 06/18/16  3:49 PM  Result Value Ref Range Status   Specimen Description URINE, RANDOM  Final   Special Requests NONE  Final   Culture MULTIPLE SPECIES PRESENT, SUGGEST RECOLLECTION (A)  Final   Report Status 06/19/2016 FINAL  Final         Radiology Studies: Ct Head Wo Contrast  Result Date: 06/18/2016 CLINICAL DATA:  Confusion. EXAM: CT HEAD WITHOUT CONTRAST TECHNIQUE: Contiguous axial images were obtained from the base of the skull through the vertex without intravenous contrast. COMPARISON:   August 04, 2011 FINDINGS: There is fluid in the right mastoid air cells with no bony erosion or overlying soft tissue swelling. The middle ears, left mastoid air cells, and paranasal sinuses are normal. Bones are otherwise unremarkable. Postoperative change in the left orbit. Extracranial soft tissues are otherwise normal. No subdural, epidural, or subarachnoid hemorrhage. Ventricles and sulci are unchanged. Stable mild to moderate white matter changes. No acute cortical ischemia or infarct. Cerebellum, brainstem, and basal cisterns are normal. No mass, mass effect, or midline shift. IMPRESSION: 1. Fluid in the right mastoid air cells with no bony erosion or overlying soft tissue swelling. 2. No other acute abnormalities identified. Electronically Signed   By: Gerome Samavid  Williams III M.D   On: 06/18/2016 15:50  Scheduled Meds: . ammonium lactate   Topical Daily  . antiseptic oral rinse  7 mL Mouth Rinse BID  . aspirin EC  81 mg Oral Daily  . atorvastatin  10 mg Oral Daily  . cefTRIAXone (ROCEPHIN)  IV  1 g Intravenous Q24H  . ciprofloxacin  1 drop Both Eyes Q4H while awake  . heparin  5,000 Units Subcutaneous Q8H  . insulin aspart  0-9 Units Subcutaneous TID WC  . insulin aspart  4 Units Subcutaneous TID WC  . insulin glargine  15 Units Subcutaneous QHS  . insulin glargine  20 Units Subcutaneous Daily  . lisinopril  5 mg Oral Daily  . metoprolol tartrate  25 mg Oral BID  . mupirocin cream   Topical Daily  . pantoprazole  40 mg Oral Daily  . polycarbophil  625 mg Oral Daily  . senna  1 tablet Oral BID  . sodium chloride  1,000 mL Intravenous Once  . sodium chloride flush  3 mL Intravenous Q12H   Continuous Infusions: . sodium chloride 75 mL/hr at 06/19/16 2356     LOS: 1 day    Time spent: 35 minutes    Alba Cory, MD [facility] Pager [phone]  If 7PM-7AM, please contact night-coverage [email or web address] Password [email or web address] 06/20/2016, 2:08 PM

## 2016-06-20 NOTE — Evaluation (Signed)
Occupational Therapy Evaluation Patient Details Name: Campbell LernerKathleen Poucher MRN: [id number] DOB: 06/02/1920 Today's Date: 06/20/2016    History of Present Illness 82 yo female with onset of confusion from UTI, metabolic encephalopathy, lactic acidosis with resolution, hypokalemia.  PMHx:  B blindness, DM, HTN   Clinical Impression   Patient presenting with decreased endurance, I in self care, balance, functional mobility/transfers, strength, and safety. Patient required assist from family in all areas of self care PTA. She was able to assist with transfers and take 5-6 steps as needed to get into bathroom with RW per her sister. Patient currently functioning min - mod A for bed mobility and set up A for grooming. Pt declined all other tasks this session.  Patient will benefit from acute OT to increase overall independence in the areas of ADLs, functional mobility, and safey in order to safely discharge to next venue of care.     Follow Up Recommendations  SNF    Equipment Recommendations  Other (comment) (defer to next venue of care)    Recommendations for Other Services       Precautions / Restrictions Precautions Precautions: Fall Precaution Comments: B blindness, speak to pt at door to alert her Restrictions Weight Bearing Restrictions: No Other Position/Activity Restrictions: has pain in B legs      Mobility Bed Mobility Overal bed mobility: Needs Assistance Bed Mobility: Rolling Rolling: Min assist;Mod assist   Supine to sit: Min assist;Mod assist Sit to supine: Min assist;Mod assist   General bed mobility comments: min A to roll to R and mod A to roll to L   Transfers Overall transfer level:  (declined to try)      General transfer comment: Pt and caregiver verbalized that pt performed transfers and ambulating 5-6 steps as needed with assist of 1 person at home prior to arrival.     Balance Overall balance assessment: Needs assistance Sitting-balance support: Feet  supported Sitting balance-Leahy Scale: Fair Sitting balance - Comments: fair once set                 ADL Overall ADL's : Needs assistance/impaired         General ADL Comments: Pt declined all OOB assessments/transfers and sitting EOB. Pt performed bed mobility by rolling to R with min A and to L with mod A. Pt required set up A for grooming tasks from bed level.                Pertinent Vitals/Pain Pain Assessment: No/denies pain Faces Pain Scale: Hurts even more Pain Location: R ant lower leg, L foot Pain Descriptors / Indicators: Aching;Sore Pain Intervention(s): Monitored during session;Limited activity within patient's tolerance;Repositioned     Hand Dominance Right   Extremity/Trunk Assessment Upper Extremity Assessment Upper Extremity Assessment: Generalized weakness   Lower Extremity Assessment Lower Extremity Assessment: Generalized weakness   Cervical / Trunk Assessment Cervical / Trunk Assessment: Kyphotic   Communication Communication Communication: No difficulties   Cognition Arousal/Alertness: Awake/alert Behavior During Therapy: Flat affect Overall Cognitive Status: No family/caregiver present to determine baseline cognitive functioning       Memory: Decreased recall of precautions;Decreased short-term memory                        Home Living Family/patient expects to be discharged to:: Private residence Living Arrangements: Children;Other relatives Available Help at Discharge: Family;Available 24 hours/day Type of Home: House Home Access: Other (comment) (could not remember)     Home  Layout: One level     Bathroom Shower/Tub: Other (comment) (family gives bed bath)   Bathroom Toilet: Handicapped height Bathroom Accessibility: Yes How Accessible: Accessible via walker Home Equipment: Walker - 2 wheels;Bedside commode   Additional Comments: sister present in room during evaluation. She reports pt needs 24/7 care and it  is provided between 4-5 family members.       Prior Functioning/Environment Level of Independence: Needs assistance  Gait / Transfers Assistance Needed: pt taking 5-6 steps for functional transfers but not functionally ambulating at home.  ADL's / Homemaking Assistance Needed: family assists with self care tasks at home        OT Diagnosis: Generalized weakness   OT Problem List: Decreased strength;Decreased activity tolerance;Impaired balance (sitting and/or standing);Decreased safety awareness;Decreased knowledge of use of DME or AE   OT Treatment/Interventions: Self-care/ADL training;Therapeutic exercise;DME and/or AE instruction;Energy conservation;Therapeutic activities;Modalities    OT Goals(Current goals can be found in the care plan section) Acute Rehab OT Goals Patient Stated Goal: to get home again OT Goal Formulation: With patient/family Time For Goal Achievement: 07/04/16 Potential to Achieve Goals: Fair ADL Goals Pt Will Perform Grooming: with set-up;with supervision Pt Will Perform Upper Body Bathing: with set-up;with supervision Pt Will Perform Lower Body Bathing: with mod assist;bed level Pt Will Perform Upper Body Dressing: with set-up;with supervision Pt Will Perform Lower Body Dressing: with mod assist Pt Will Transfer to Toilet: with mod assist;ambulating Pt Will Perform Toileting - Clothing Manipulation and hygiene: with min assist  OT Frequency: Min 2X/week   Barriers to D/C:    none known at this time          End of Session    Activity Tolerance: Patient limited by fatigue Patient left: in bed;with call bell/phone within reach   Time: 1610-9604 OT Time Calculation (min): 25 min Charges:  OT General Charges $OT Visit: 1 Procedure OT Evaluation $OT Eval Moderate Complexity: 1 Procedure OT Treatments $Self Care/Home Management : 8-22 mins G-Codes:    Lowella Grip, MS, OTR/L 06/20/2016, 2:38 PM

## 2016-06-21 ENCOUNTER — Encounter (HOSPITAL_COMMUNITY): Payer: Self-pay | Admitting: General Practice

## 2016-06-21 LAB — BASIC METABOLIC PANEL
Anion gap: 7 (ref 5–15)
BUN: 9 mg/dL (ref 6–20)
CALCIUM: 8.8 mg/dL — AB (ref 8.9–10.3)
CHLORIDE: 102 mmol/L (ref 101–111)
CO2: 29 mmol/L (ref 22–32)
CREATININE: 0.87 mg/dL (ref 0.44–1.00)
GFR calc non Af Amer: 55 mL/min — ABNORMAL LOW (ref >60)
Glucose, Bld: 81 mg/dL (ref 65–99)
Potassium: 4.1 mmol/L (ref 3.5–5.1)
Sodium: 138 mmol/L (ref 135–145)

## 2016-06-21 LAB — CBC
HCT: 38.2 % (ref 36.0–46.0)
Hemoglobin: 12.5 g/dL (ref 12.0–15.0)
MCH: 30.6 pg (ref 26.0–34.0)
MCHC: 32.7 g/dL (ref 30.0–36.0)
MCV: 93.4 fL (ref 78.0–100.0)
PLATELETS: 101 10*3/uL — AB (ref 150–400)
RBC: 4.09 MIL/uL (ref 3.87–5.11)
RDW: 13.3 % (ref 11.5–15.5)
WBC: 4.2 10*3/uL (ref 4.0–10.5)

## 2016-06-21 LAB — GLUCOSE, CAPILLARY
GLUCOSE-CAPILLARY: 79 mg/dL (ref 65–99)
GLUCOSE-CAPILLARY: 137 mg/dL — AB (ref 65–99)
GLUCOSE-CAPILLARY: 133 mg/dL — AB (ref 65–99)
Glucose-Capillary: 103 mg/dL — ABNORMAL HIGH (ref 65–99)

## 2016-06-21 MED ORDER — INSULIN GLARGINE 100 UNIT/ML ~~LOC~~ SOLN
10.0000 [IU] | Freq: Every day | SUBCUTANEOUS | Status: DC
Start: 2016-06-21 — End: 2016-06-22
  Administered 2016-06-21: 10 [IU] via SUBCUTANEOUS
  Filled 2016-06-21 (×2): qty 0.1

## 2016-06-21 NOTE — Progress Notes (Signed)
PROGRESS NOTE    Julia Oliver  UJW:[id number] DOB: 11-10-1920 DOA: 06/18/2016 PCP: Verl Bangs, MD    Brief Narrative: Julia Oliver  is a 82 y.o. female, With past medical history relevant for hypertension diabetes and vision loss who presents with family with concerns about worsening delirium/confusional episodes with visual hallucinations. Patient was seen at [facility] on 06/15/2016 diagnosed with a UTI and started on Cipro, she has been compliant with Cipro but confusional episodes with visual hallucinations have gotten worse, no fevers no vomiting no diarrhea no flank pain no abdominal pain. Family members at bedside, questions answered   Assessment & Plan:   Principal Problem:   Confusion state Active Problems:   HTN (hypertension)   Diabetes mellitus, type II (HCC)   Confusion   Encephalopathy  Metabolic encephalopathy/delirium  secondary to UTI. more calm this morning.  Continue with treatment of infection. PT consult.  CT head negative.   Lactic acidosis; improved with fluids.   UTI; UA with 6-30 WBC.  Follow urine culture, although patient was already on antibiotics.   blood culture no growth to date .  Continue with ceftriaxone . Day 3.  Urine culture; multiples bacterial morphotype.   Hypokalemia; replace orally.   DM- will change 70/30 to lantus to avoid hypoglycemia, SSI  lantus 22 in am and 15 at bedtime Will decrease lantus to 10 units  at bedtime to avoid hypoglycemia.   HTN-stable, continue metoprolol, lisinopril, resume lasix   Bilateral blindness;   Conjunctivitis; cipro otic.    DVT prophylaxis: Heparin  Code Status: Full Code.  Family Communication: daughter at bedside  Disposition Plan: remain inpatient , SNF in 24 hours.    Consultants:   None  Procedures:   none   Antimicrobials:   Ceftriaxone 8-06   Subjective: Patient report that she got confuse last night and feeling better this am.     Objective: Vitals:   06/20/16 1504 06/20/16 2147 06/21/16 0500 06/21/16 1347  BP: (!) 147/47 (!) 171/51 (!) 140/48 (!) 120/40  Pulse: (!) 51 61 60 (!) 55  Resp: (!) Temp: 97.8 F (36.6 C) 97.8 F (36.6 C) 98.2 F (36.8 C) 98.1 F (36.7 C)  TempSrc: Oral Oral Oral Oral  SpO2: 99% 99% 95% 100%  Weight:       No intake or output data in the 24 hours ending 06/21/16 1547 Filed Weights   06/18/16 1600  Weight: 74.8 kg (165 lb)    Examination:  General exam: Appears calm and comfortable  Respiratory system: Clear to auscultation. Respiratory effort normal. Cardiovascular system: S1 & S2 heard, RRR. No JVD, murmurs, rubs, gallops or clicks. No pedal edema. Gastrointestinal system: Abdomen is nondistended, soft and nontender. No organomegaly or masses felt. Normal bowel sounds heard. Central nervous system: Alert and oriented. No focal neurological deficits. Extremities: Symmetric 5 x 5 power. Skin: No rashes, lesions or ulcers     Data Reviewed: I have personally reviewed following labs and imaging studies  CBC:  Recent Labs Lab 06/18/16 1102 06/19/16 0719 06/21/16 0716  WBC 5.2 3.7* 4.2  NEUTROABS 3.8  --   --   HGB 13.8 12.4 12.5  HCT 41.1 37.9 38.2  MCV 92.2 92.4 93.4  PLT 134* 108* 101*   Basic Metabolic Panel:  Recent Labs Lab 06/18/16 1102 06/19/16 0719 06/21/16 0716  NA 132* 137 138  K 3.7 3.2* 4.1  CL 96* 103 102  CO2 GLUCOSE 277*  238* 81  BUN 18 10 9   CREATININE 1.14* 0.86 0.87  CALCIUM 8.9 8.3* 8.8*   GFR: CrCl cannot be calculated (Unknown ideal weight.). Liver Function Tests:  Recent Labs Lab 06/18/16 1102  AST 25  ALT 16  ALKPHOS 79  BILITOT 1.5*  PROT 7.3  ALBUMIN 3.6   No results for input(s): LIPASE, AMYLASE in the last 168 hours. No results for input(s): AMMONIA in the last 168 hours. Coagulation Profile: No results for input(s): INR, PROTIME in the last 168 hours. Cardiac Enzymes: No results  for input(s): CKTOTAL, CKMB, CKMBINDEX, TROPONINI in the last 168 hours. BNP (last 3 results) No results for input(s): PROBNP in the last 8760 hours. HbA1C: No results for input(s): HGBA1C in the last 72 hours. CBG:  Recent Labs Lab 06/20/16 1206 06/20/16 1649 06/20/16 2007 06/21/16 0746 06/21/16 1213  GLUCAP 177* 142* 82 79 137*   Lipid Profile: No results for input(s): CHOL, HDL, LDLCALC, TRIG, CHOLHDL, LDLDIRECT in the last 72 hours. Thyroid Function Tests: No results for input(s): TSH, T4TOTAL, FREET4, T3FREE, THYROIDAB in the last 72 hours. Anemia Panel: No results for input(s): VITAMINB12, FOLATE, FERRITIN, TIBC, IRON, RETICCTPCT in the last 72 hours. Sepsis Labs:  Recent Labs Lab 06/18/16 1117 06/18/16 1425 06/19/16 0025  LATICACIDVEN 3.65* 3.00* 0.9    Recent Results (from the past 240 hour(s))  Culture, blood (Routine x 2)     Status: None (Preliminary result)   Collection Time: 06/18/16 11:02 AM  Result Value Ref Range Status   Specimen Description BLOOD RIGHT ANTECUBITAL  Final   Special Requests BOTTLES DRAWN AEROBIC AND ANAEROBIC  5CC  Final   Culture NO GROWTH 3 DAYS  Final   Report Status PENDING  Incomplete  Culture, blood (Routine x 2)     Status: None (Preliminary result)   Collection Time: 06/18/16 12:10 PM  Result Value Ref Range Status   Specimen Description BLOOD RIGHT ANTECUBITAL  Final   Special Requests BOTTLES DRAWN AEROBIC AND ANAEROBIC  10CC  Final   Culture NO GROWTH 3 DAYS  Final   Report Status PENDING  Incomplete  Urine culture     Status: Abnormal   Collection Time: 06/18/16  3:49 PM  Result Value Ref Range Status   Specimen Description URINE, RANDOM  Final   Special Requests NONE  Final   Culture MULTIPLE SPECIES PRESENT, SUGGEST RECOLLECTION (A)  Final   Report Status 06/19/2016 FINAL  Final         Radiology Studies: No results found.      Scheduled Meds: . ammonium lactate   Topical Daily  . antiseptic oral  rinse  7 mL Mouth Rinse BID  . aspirin EC  81 mg Oral Daily  . atorvastatin  10 mg Oral Daily  . cefTRIAXone (ROCEPHIN)  IV  1 g Intravenous Q24H  . ciprofloxacin  1 drop Both Eyes Q4H while awake  . furosemide  40 mg Oral Daily  . heparin  5,000 Units Subcutaneous Q8H  . insulin aspart  0-9 Units Subcutaneous TID WC  . insulin aspart  4 Units Subcutaneous TID WC  . insulin glargine  15 Units Subcutaneous QHS  . insulin glargine  22 Units Subcutaneous Daily  . lisinopril  5 mg Oral Daily  . metoprolol tartrate  25 mg Oral BID  . mupirocin cream   Topical Daily  . pantoprazole  40 mg Oral Daily  . polycarbophil  625 mg Oral Daily  . senna  1 tablet Oral  BID  . sodium chloride  1,000 mL Intravenous Once  . sodium chloride flush  3 mL Intravenous Q12H   Continuous Infusions:     LOS: 2 days    Time spent: 35 minutes    Alba Coryegalado, Dartanion Teo A, MD [facility] Pager [phone]  If 7PM-7AM, please contact night-coverage [email or web address] Password [email or web address] 06/21/2016, 3:47 PM

## 2016-06-21 NOTE — Care Management Note (Signed)
Case Management Note  Patient Details  Name: Julia Oliver MRN: [id number] Date of Birth: 12-Mar-1920  Subjective/Objective:                Pt with past medical history relevant for hypertension diabetes and vision loss who presents with family with concerns about worsening delirium/confusional episodes with visual hallucinations.   Action/Plan: Plan is to d/c to SNF on 06/21/2016.  Expected Discharge Date:    06/21/2016        Expected Discharge Plan:  Skilled Nursing Facility  In-House Referral:  Clinical Social Work  Discharge planning Services  CM Consult  Post Acute Care Choice:    Choice offered to:     DME Arranged:    DME Agency:     HH Arranged:    HH Agency:     Status of Service:  In process, will continue to follow  If discussed at Long Length of Stay Meetings, dates discussed:    Additional Comments:  Epifanio LeschesCole, Sheritta Deeg Hudson, [place]RN,BSN,CM [phone] 06/21/2016, 10:49 AM

## 2016-06-21 NOTE — Progress Notes (Signed)
Occupational Therapy Treatment Patient Details Name: Briggett Tuccillo MRN: [id number] DOB: 08-Apr-1920 Today's Date: 06/21/2016    History of present illness 82 yo female with onset of confusion from UTI, metabolic encephalopathy, lactic acidosis with resolution, hypokalemia.  PMHx:  B blindness, DM, HTN   OT comments  Pt willing to participate in OT and sat EOB for grooming and UE exercises  Follow Up Recommendations  SNF    Equipment Recommendations   (defer to next venue)    Recommendations for Other Services      Precautions / Restrictions Precautions Precautions: Fall Restrictions Weight Bearing Restrictions: No       Mobility Bed Mobility               General bed mobility comments: see adl comments above  Transfers                 General transfer comment: not attempted at pt's request    Balance     Sitting balance-Leahy Scale: Fair                             ADL Overall ADL's : Needs assistance/impaired     Grooming: Brushing hair;Sitting;Set up;Supervision/safety                                 General ADL Comments: assisted to EOB with mod A using HOB raised to assist.  Max A to scoot towards EOB and +2 to reposition back in bed.  Pt performed above grooming activity and UE exercises from EOB (AROM) 5 reps FF and horizontal abduction      Vision                     Perception     Praxis      Cognition   Behavior During Therapy: Flat affect Overall Cognitive Status: No family/caregiver present to determine baseline cognitive functioning                       Extremity/Trunk Assessment               Exercises     Shoulder Instructions       General Comments      Pertinent Vitals/ Pain       Pain Assessment: No/denies pain  Home Living                                          Prior Functioning/Environment              Frequency       Progress  Toward Goals  OT Goals(current goals can now be found in the care plan section)  Progress towards OT goals: Progressing toward goals     Plan      Co-evaluation                 End of Session     Activity Tolerance Patient tolerated treatment well   Patient Left in bed;with call bell/phone within reach;with bed alarm set   Nurse Communication          Time: 4098-1191 OT Time Calculation (min): 21 min  Charges: OT General Charges $OT Visit: 1 Procedure OT Treatments $Self Care/Home Management :  8-22 mins  Haile Bosler 06/21/2016, 3:24 PM  Marica OtterMaryellen Detravion Tester, OTR/L [phone] 06/21/2016

## 2016-06-22 DIAGNOSIS — F4489 Other dissociative and conversion disorders: Secondary | ICD-10-CM

## 2016-06-22 LAB — GLUCOSE, CAPILLARY
GLUCOSE-CAPILLARY: 111 mg/dL — AB (ref 65–99)
Glucose-Capillary: 90 mg/dL (ref 65–99)
Glucose-Capillary: 143 mg/dL — ABNORMAL HIGH (ref 65–99)

## 2016-06-22 MED ORDER — INSULIN GLARGINE 100 UNIT/ML ~~LOC~~ SOLN: 10.0000 [IU] | Freq: Every day | SUBCUTANEOUS | 11 refills | Status: AC

## 2016-06-22 MED ORDER — MUPIROCIN CALCIUM 2 % EX CREA: TOPICAL_CREAM | Freq: Every day | CUTANEOUS | 0 refills | Status: AC

## 2016-06-22 MED ORDER — DOXYCYCLINE HYCLATE 100 MG PO CAPS: 100.0000 mg | ORAL_CAPSULE | Freq: Two times a day (BID) | ORAL | 0 refills | Status: AC

## 2016-06-22 MED ORDER — TRAMADOL HCL 50 MG PO TABS: 50.0000 mg | ORAL_TABLET | Freq: Every evening | ORAL | 0 refills | Status: AC | PRN

## 2016-06-22 MED ORDER — INSULIN ASPART 100 UNIT/ML ~~LOC~~ SOLN: 4.0000 [IU] | Freq: Three times a day (TID) | SUBCUTANEOUS | 11 refills | Status: AC

## 2016-06-22 MED ORDER — LISINOPRIL 5 MG PO TABS: 5.0000 mg | ORAL_TABLET | Freq: Every day | ORAL | 0 refills | Status: AC

## 2016-06-22 MED ORDER — INSULIN ASPART 100 UNIT/ML ~~LOC~~ SOLN: SUBCUTANEOUS | 11 refills | Status: AC

## 2016-06-22 MED ORDER — AMMONIUM LACTATE 12 % EX LOTN: TOPICAL_LOTION | Freq: Every day | CUTANEOUS | 0 refills | Status: AC

## 2016-06-22 MED ORDER — METOPROLOL TARTRATE 25 MG PO TABS: 25.0000 mg | ORAL_TABLET | Freq: Two times a day (BID) | ORAL | 0 refills | Status: AC

## 2016-06-22 NOTE — Discharge Summary (Signed)
Discharge Summary  Julia Oliver ZOX:[id number] DOB: December 25, 1919  PCP: Verl Bangs, MD  Admit date: 06/18/2016 Discharge date: 06/22/2016  Time spent: >30mins  Recommendations for Outpatient Follow-up:  1. F/u with PMD at [facility] for hospital discharge follow up, repeat cbc/bmp at follow up 2. Continue wound care at SNF: Cleanse bilateral lower legs with soap and water.  Apply Bactroban cream to trauma wound on right lower leg.   Amlactin cream to bilateral lower legs, dry skin.   Discharge Diagnoses:  Active Hospital Problems   Diagnosis Date Noted  . Confusion state 06/18/2016  . Encephalopathy 06/19/2016  . Confusion 06/18/2016  . HTN (hypertension) 07/04/2012  . Diabetes mellitus, type II (HCC) 07/04/2012    Resolved Hospital Problems   Diagnosis Date Noted Date Resolved  No resolved problems to display.    Discharge Condition: stable  Diet recommendation: heart healthy/carb modified  Filed Weights   06/18/16 1600  Weight: 74.8 kg (165 lb)    History of present illness:  Julia Oliver  is a 82 y.o. female, With past medical history relevant for hypertension diabetes and vision loss who presents with family with concerns about worsening delirium/confusional episodes with visual hallucinations. Patient was seen at [facility] on 06/15/2016 diagnosed with a UTI and started on Cipro, she has been compliant with Cipro but confusional episodes with visual hallucinations have gotten worse, no fevers no vomiting no diarrhea no flank pain no abdominal pain. Family members at bedside, questions answered  Hospital Course:  Principal Problem:   Confusion state Active Problems:   HTN (hypertension)   Diabetes mellitus, type II (HCC)   Confusion   Encephalopathy  Metabolic encephalopathy/delirium  secondary to UTI. She was started on cipro prior to being admitted to the hospital, urine and blood culture here no growth.  CT head negative.  Continue  abx for presumed uti to finish total of 7 days treatment started from 8/5  Lactic acidosis; resolved at discharge   UTI; UA with 6-30 WBC.  Urine culture; multiples bacterial morphotype. blood culture no growth to date .  She did have dysuria and confusion which has resolved at discharge She is treated with cipro prior to hospitalization without improvement, she is started on ceftriaxone since being admitted and improved, will d/c on doxycycline for another 3three days to finish total of 7 day treatment for complicated UTI. Marland Kitchen    Hypokalemia; replaced orally.   Insulin dependent DM- I  She is discharged on lantus to 10 units  at bedtime to avoid hypoglycemia.  ssi and meal coverage insulin, insulin dose need to be continue adjusted  HTN-stable, continue metoprolol, lisinopril, resume lasix   Bilateral blindness;   Conjunctivitis; cipro otic. improved   DVT prophylaxis while in the hospital: Heparin  Code Status: Full Code.  Family Communication: patient  Disposition Plan: SNF 8/9   Consultants:   None  Procedures:   none   Antimicrobials:   Ceftriaxone 8-06    Discharge Exam: BP (!) 126/50 (BP Location: Right Arm)   Pulse (!) 59   Temp 97.7 F (36.5 C)   Resp 20   Ht 5' (1.524 m)   Wt 74.8 kg (165 lb)   SpO2 94%   BMI 29.23 kg/m    General exam: Appears calm and comfortable , aaox3, pleasant and appropriate, legally blind Respiratory system: Clear to auscultation. Respiratory effort normal. Cardiovascular system: S1 & S2 heard, RRR. No JVD, murmurs, rubs, gallops or clicks. No pedal edema. Gastrointestinal system: Abdomen is  nondistended, soft and nontender. No organomegaly or masses felt. Normal bowel sounds heard. Central nervous system: Alert and oriented. No focal neurological deficits. Extremities: Symmetric 5 x 5 power. Skin: right lower leg skin abrasion x1 , 3-4 cm in diameter, does not look infected with minor bleeding, transparent  dressing on.     Discharge Instructions You were cared for by a hospitalist during your hospital stay. If you have any questions about your discharge medications or the care you received while you were in the hospital after you are discharged, you can call the unit and asked to speak with the hospitalist on call if the hospitalist that took care of you is not available. Once you are discharged, your primary care physician will handle any further medical issues. Please note that NO REFILLS for any discharge medications will be authorized once you are discharged, as it is imperative that you return to your primary care physician (or establish a relationship with a primary care physician if you do not have one) for your aftercare needs so that they can reassess your need for medications and monitor your lab values.  Discharge Instructions    Diet - low sodium heart healthy    Complete by:  As directed   Carb modified   Increase activity slowly    Complete by:  As directed       Medication List    STOP taking these medications   insulin NPH-regular Human (70-30) 100 UNIT/ML injection Commonly known as:  NOVOLIN 70/30   omeprazole 20 MG capsule Commonly known as:  PRILOSEC     TAKE these medications   ammonium lactate 12 % lotion Commonly known as:  LAC-HYDRIN Apply topically daily.   aspirin 81 MG tablet Take 81 mg by mouth daily.   atorvastatin 10 MG tablet Commonly known as:  LIPITOR Take 10 mg by mouth daily. What changed:  Another medication with the same name was removed. Continue taking this medication, and follow the directions you see here.   cholecalciferol 1000 units tablet Commonly known as:  VITAMIN D Take 1,000 Units by mouth daily.   clotrimazole-betamethasone cream Commonly known as:  LOTRISONE Apply 1 application topically 2 (two) times daily.   docusate sodium 100 MG capsule Commonly known as:  COLACE Take 100 mg by mouth 2 (two) times daily as needed for  mild constipation.   doxycycline 100 MG capsule Commonly known as:  VIBRAMYCIN Take 1 capsule (100 mg total) by mouth 2 (two) times daily.   Fiber 625 MG Tabs Take 625 mg by mouth daily.   furosemide 40 MG tablet Commonly known as:  LASIX Take 40 mg by mouth daily.   insulin aspart 100 UNIT/ML injection Commonly known as:  novoLOG Inject 4 Units into the skin 3 (three) times daily with meals.   insulin aspart 100 UNIT/ML injection Commonly known as:  novoLOG Before each meal 3 times a day, 140-199 - 2 units, 200-250 - 4 units, 251-299 - 6 units,  300-349 - 8 units,  350 or above 10 units. Insulin PEN if approved, provide syringes and needles if needed.   insulin glargine 100 UNIT/ML injection Commonly known as:  LANTUS Inject 0.1 mLs (10 Units total) into the skin at bedtime.   lisinopril 5 MG tablet Commonly known as:  PRINIVIL,ZESTRIL Take 1 tablet (5 mg total) by mouth daily. What changed:  medication strength  how much to take   metoprolol tartrate 25 MG tablet Commonly known as:  LOPRESSOR Take  1 tablet (25 mg total) by mouth 2 (two) times daily. What changed:  medication strength  how much to take   mometasone 0.1 % cream Commonly known as:  ELOCON Apply 1 application topically daily.   mupirocin cream 2 % Commonly known as:  BACTROBAN Apply topically daily.   polyethylene glycol packet Commonly known as:  MIRALAX / GLYCOLAX Take 17 g by mouth daily as needed for mild constipation. Alternates with colace po   traMADol 50 MG tablet Commonly known as:  ULTRAM Take 50 mg by mouth at bedtime as needed.   vitamin B-12 500 MCG tablet Commonly known as:  CYANOCOBALAMIN Take 250 mcg by mouth daily.      Allergies  Allergen Reactions  . Novocain [Procaine Hcl] Other (See Comments)    Pt. passed out  . Penicillins Hives  . Sulfa Antibiotics Nausea And Vomiting   Follow-up Information    RADIONTCHENKO, Roxy Manns, MD Follow up in 2 week(s).     Specialty:  Family Medicine Why:  hospital discharge follow up Contact information: [address] [place] [address] [phone]            The results of significant diagnostics from this hospitalization (including imaging, microbiology, ancillary and laboratory) are listed below for reference.    Significant Diagnostic Studies: Dg Chest 2 View  Result Date: 06/18/2016 CLINICAL DATA:  Shortness of breath.  Sepsis. EXAM: CHEST  2 VIEW COMPARISON:  07/04/2012 FINDINGS: Normal heart size. There is no pleural effusion or edema. No airspace consolidation. Coarsened interstitial markings are present within the lungs. IMPRESSION: 1. No acute findings. 2. Chronic interstitial coarsening noted. Electronically Signed   By: Signa Kell M.D.   On: 06/18/2016 12:01   Ct Head Wo Contrast  Result Date: 06/18/2016 CLINICAL DATA:  Confusion. EXAM: CT HEAD WITHOUT CONTRAST TECHNIQUE: Contiguous axial images were obtained from the base of the skull through the vertex without intravenous contrast. COMPARISON:  August 04, 2011 FINDINGS: There is fluid in the right mastoid air cells with no bony erosion or overlying soft tissue swelling. The middle ears, left mastoid air cells, and paranasal sinuses are normal. Bones are otherwise unremarkable. Postoperative change in the left orbit. Extracranial soft tissues are otherwise normal. No subdural, epidural, or subarachnoid hemorrhage. Ventricles and sulci are unchanged. Stable mild to moderate white matter changes. No acute cortical ischemia or infarct. Cerebellum, brainstem, and basal cisterns are normal. No mass, mass effect, or midline shift. IMPRESSION: 1. Fluid in the right mastoid air cells with no bony erosion or overlying soft tissue swelling. 2. No other acute abnormalities identified. Electronically Signed   By: Gerome Sam III M.D   On: 06/18/2016 15:50    Microbiology: Recent Results (from the past 240 hour(s))  Culture, blood  (Routine x 2)     Status: None (Preliminary result)   Collection Time: 06/18/16 11:02 AM  Result Value Ref Range Status   Specimen Description BLOOD RIGHT ANTECUBITAL  Final   Special Requests BOTTLES DRAWN AEROBIC AND ANAEROBIC  5CC  Final   Culture NO GROWTH 3 DAYS  Final   Report Status PENDING  Incomplete  Culture, blood (Routine x 2)     Status: None (Preliminary result)   Collection Time: 06/18/16 12:10 PM  Result Value Ref Range Status   Specimen Description BLOOD RIGHT ANTECUBITAL  Final   Special Requests BOTTLES DRAWN AEROBIC AND ANAEROBIC  10CC  Final   Culture NO GROWTH 3 DAYS  Final   Report Status PENDING  Incomplete  Urine culture     Status: Abnormal   Collection Time: 06/18/16  3:49 PM  Result Value Ref Range Status   Specimen Description URINE, RANDOM  Final   Special Requests NONE  Final   Culture MULTIPLE SPECIES PRESENT, SUGGEST RECOLLECTION (A)  Final   Report Status 06/19/2016 FINAL  Final     Labs: Basic Metabolic Panel:  Recent Labs Lab 06/18/16 1102 06/19/16 0719 06/21/16 0716  NA 132* 137 138  K 3.7 3.2* 4.1  CL 96* 103 102  CO2 28 28 29   GLUCOSE 277* 238* 81  BUN 18 10 9   CREATININE 1.14* 0.86 0.87  CALCIUM 8.9 8.3* 8.8*   Liver Function Tests:  Recent Labs Lab 06/18/16 1102  AST 25  ALT 16  ALKPHOS 79  BILITOT 1.5*  PROT 7.3  ALBUMIN 3.6   No results for input(s): LIPASE, AMYLASE in the last 168 hours. No results for input(s): AMMONIA in the last 168 hours. CBC:  Recent Labs Lab 06/18/16 1102 06/19/16 0719 06/21/16 0716  WBC 5.2 3.7* 4.2  NEUTROABS 3.8  --   --   HGB 13.8 12.4 12.5  HCT 41.1 37.9 38.2  MCV 92.2 92.4 93.4  PLT 134* 108* 101*   Cardiac Enzymes: No results for input(s): CKTOTAL, CKMB, CKMBINDEX, TROPONINI in the last 168 hours. BNP: BNP (last 3 results) No results for input(s): BNP in the last 8760 hours.  ProBNP (last 3 results) No results for input(s): PROBNP in the last 8760  hours.  CBG:  Recent Labs Lab 06/21/16 1707 06/21/16 2031 06/22/16 0636 06/22/16 0811 06/22/16 1203  GLUCAP 133* 103* 111* 90 143*       Signed:  Shaindy Reader MD, PhD  [facility] 06/22/2016, 1:25 PM

## 2016-06-22 NOTE — Progress Notes (Signed)
[facility]. VSS. Pt stable for transport. PIV removed without complication. Eye drops and litons X2 included with pt belonging bag. Pt's son has pt's brace and shoes. Pt transported with dentures (upper and lower) in and glasses on.

## 2016-06-22 NOTE — Clinical Social Work Note (Signed)
Clinical Social Work Assessment  Patient Details  Name: Julia Oliver MRN: [id number] Date of Birth: [birth year]  Date of referral:  06/22/16               Reason for consult:  Facility Placement                Permission sought to share information with:  Facility Sport and exercise psychologist, Family Supports Permission granted to share information::  Yes, Verbal Permission Granted  Name::     Julia Oliver Section::  SNFs  Relationship::  Daughter  Contact Information:  [phone]  Housing/Transportation Living arrangements for the past 2 months:  [place] of Information:  Patient, Adult Children Patient Interpreter Needed:  None Criminal Activity/Legal Involvement Pertinent to Current Situation/Hospitalization:  No - Comment as needed Significant Relationships:  Adult Children Lives with:  Adult Children, Self Do you feel safe going back to the place where you live?  No Need for family participation in patient care:  Yes (Comment)  Care giving concerns:  CSW received referral for possible SNF placement at time of discharge. CSW met with patient and patient's daughter at bedside regarding PT recommendation of SNF placement at time of discharge. Per patient's daughter, she is currently unable to care for patient at their home given patient's current physical needs and fall risk. Patient and patient's daughter expressed understanding of PT recommendation and are agreeable to SNF placement at time of discharge. CSW to continue to follow and assist with discharge planning needs.   Social Worker assessment / plan:  CSW spoke with patient and patient's daughter concerning possibility of rehab at [facility] before returning home.  Employment status:  Retired Forensic scientist:  Medicare PT Recommendations:  [place] / Referral to community resources:  [place]  Patient/Family's Response to care:  Patient and patient's daughter recognize  need for rehab before returning home and are agreeable to a SNF. Patient reported preference for [facility] since patient has been there before and it is close to the rest of the family.  Patient/Family's Understanding of and Emotional Response to Diagnosis, Current Treatment, and Prognosis:  Patient/family is realistic regarding therapy needs and expressed being hopeful for SNF placement. No questions/concerns about plan or treatment.    Emotional Assessment Appearance:  Appears stated age Attitude/Demeanor/Rapport:  Other (Appropriate) Affect (typically observed):  Accepting, Appropriate Orientation:  Oriented to Self, Oriented to Situation, Oriented to Place, Oriented to  Time Alcohol / Substance use:  Not Applicable Psych involvement (Current and /or in the community):  No (Comment)  Discharge Needs  Concerns to be addressed:  Care Coordination Readmission within the last 30 days:  No Current discharge risk:  None Barriers to Discharge:  No Barriers Identified   Benard Halsted, [place] 06/22/2016, 8:09 AM

## 2016-06-22 NOTE — Clinical Social Work Placement (Signed)
   CLINICAL SOCIAL WORK PLACEMENT  NOTE  Date:  06/22/2016  Patient Details  Name: Julia Oliver MRN: [id number] Date of Birth: 1920-06-04  Clinical Social Work is seeking post-discharge placement for this patient at the Skilled  Nursing Facility level of care (*CSW will initial, date and re-position this form in  chart as items are completed):  Yes   Patient/family provided with [facility] Clinical Social Work Department's list of facilities offering this level of care within the geographic area requested by the patient (or if unable, by the patient's family).  Yes   Patient/family informed of their freedom to choose among providers that offer the needed level of care, that participate in Medicare, Medicaid or managed care program needed by the patient, have an available bed and are willing to accept the patient.  Yes   Patient/family informed of [facility]'s ownership interest in [facility] and [facility], as well as of the fact that they are under no obligation to receive care at these facilities.  PASRR submitted to EDS on       PASRR number received on       Existing PASRR number confirmed on 06/22/16     FL2 transmitted to all facilities in geographic area requested by pt/family on 06/22/16     FL2 transmitted to all facilities within larger geographic area on       Patient informed that his/her managed care company has contracts with or will negotiate with certain facilities, including the following:        Yes   Patient/family informed of bed offers received.  Patient chooses bed at [facility]     Physician recommends and patient chooses bed at      Patient to be transferred to [facility] on 06/22/16.  Patient to be transferred to facility by [facility]     Patient family notified on 06/22/16 of transfer.  Name of family member notified:  Daughter by phone     PHYSICIAN       Additional Comment:     _______________________________________________ Mearl LatinNadia S Victorious Cosio, LCSWA 06/22/2016, 1:45 PM

## 2016-06-22 NOTE — Care Management Important Message (Signed)
Important Message  Patient Details  Name: Julia Oliver MRN: [id number] Date of Birth: 03-22-1920   Medicare Important Message Given:  Yes    Bernadette HoitShoffner, Norbert Malkin Coleman 06/22/2016, 3:07 PM

## 2016-06-22 NOTE — Progress Notes (Signed)
Patient will DC to: [facility] Anticipated DC date: 06/22/16 Family notified: Daughter by phone Transport by: [facility] 2pm   Per MD patient ready for DC to SNF. RN, patient, patient's family, and facility notified of DC. RN given number for report. DC packet on chart. Ambulance transport requested for patient.   CSW signing off.  Cristobal GoldmannNadia Collette Pescador, [place] Clinical Social Worker [phone] t

## 2016-06-22 NOTE — Progress Notes (Signed)
RN attempted to call report to [facility]. No answer after 3 separate phone calls. Voicemail left for receiving RN to call RN Samara DeistKathryn directly to receive report.

## 2016-06-23 LAB — CULTURE, BLOOD (ROUTINE X 2)
CULTURE: NO GROWTH
Culture: NO GROWTH

## 2017-03-14 DEATH — deceased

## 2017-12-28 IMAGING — DX DG CHEST 2V
2 series · 2 of 2 positions shown · non-contrast
Comparison: 07/04/2012

CLINICAL DATA: Shortness of breath.  Sepsis.

EXAM:
CHEST  2 VIEW

[w chest pa]
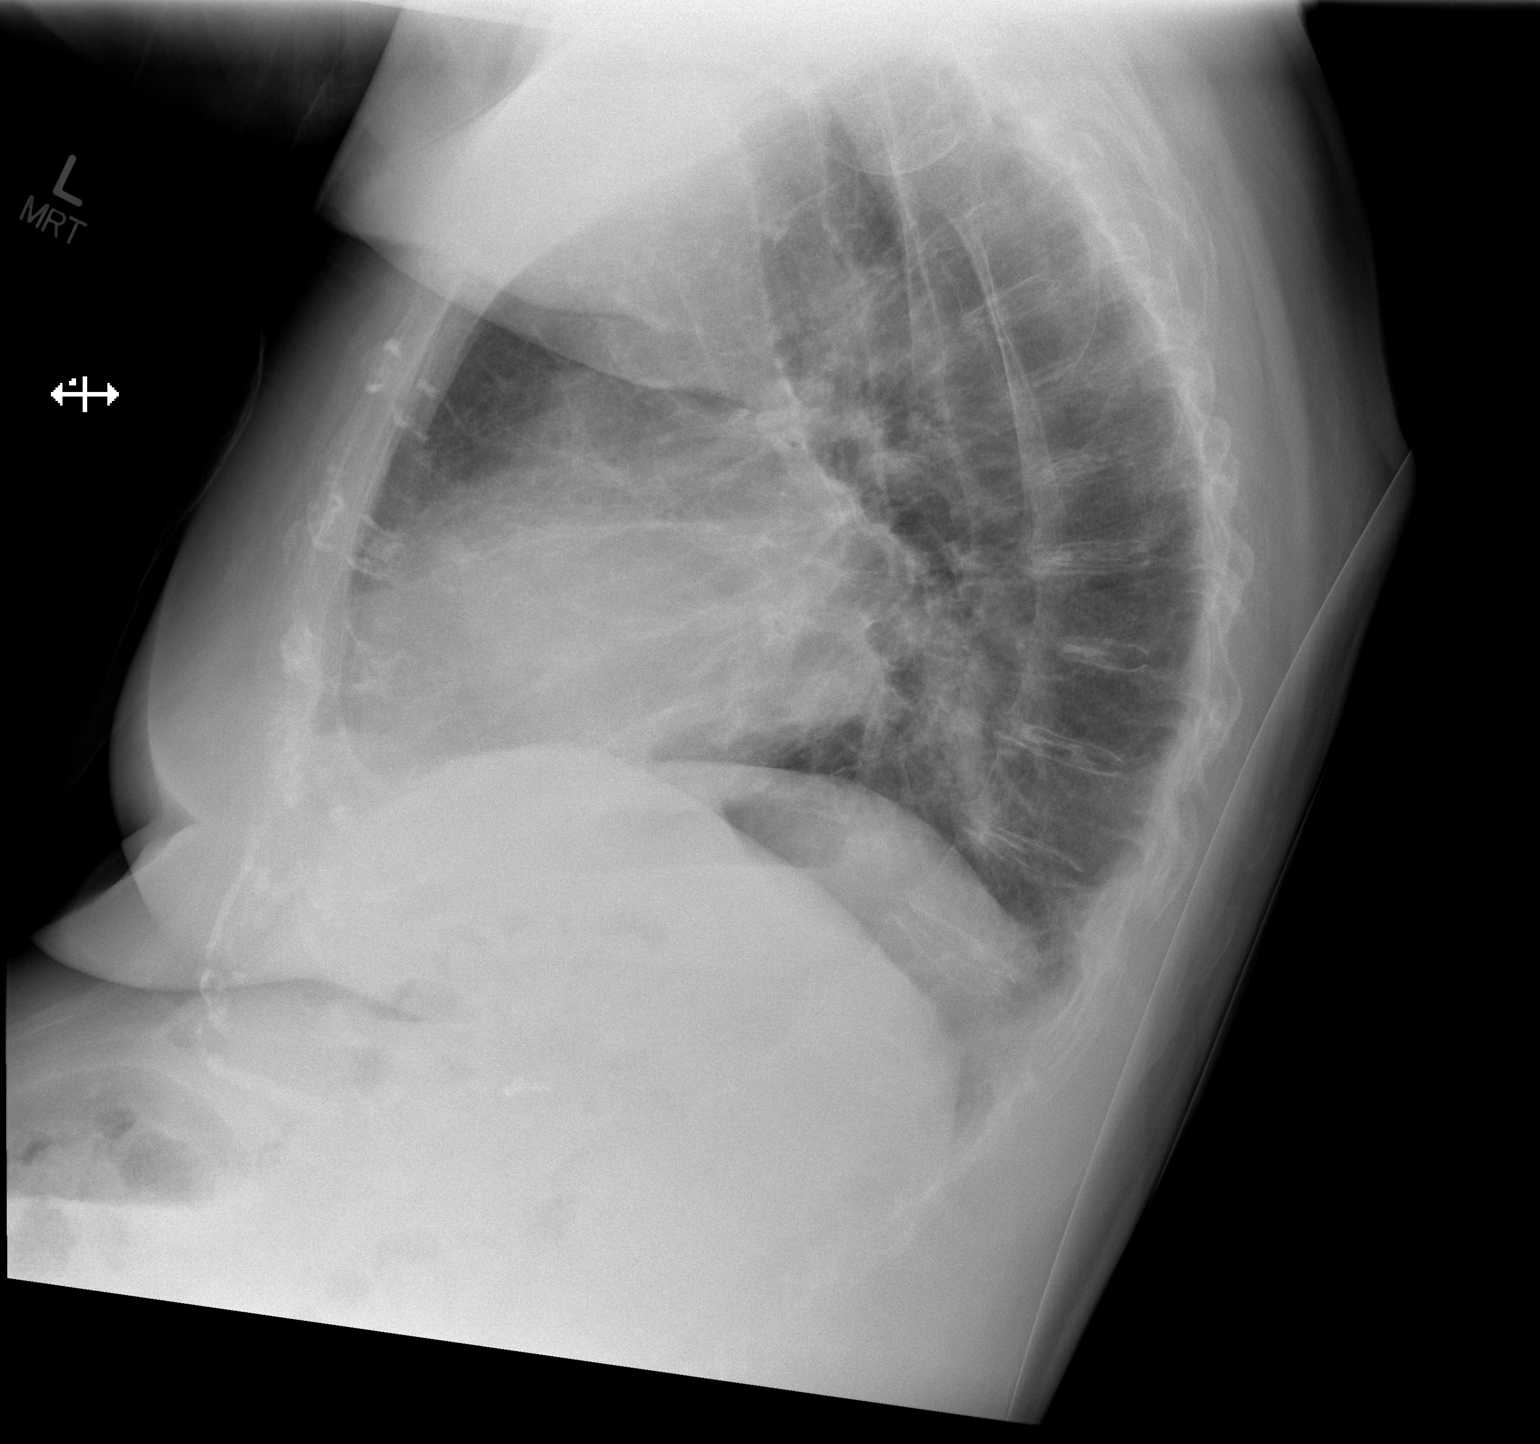

[x chest ap]
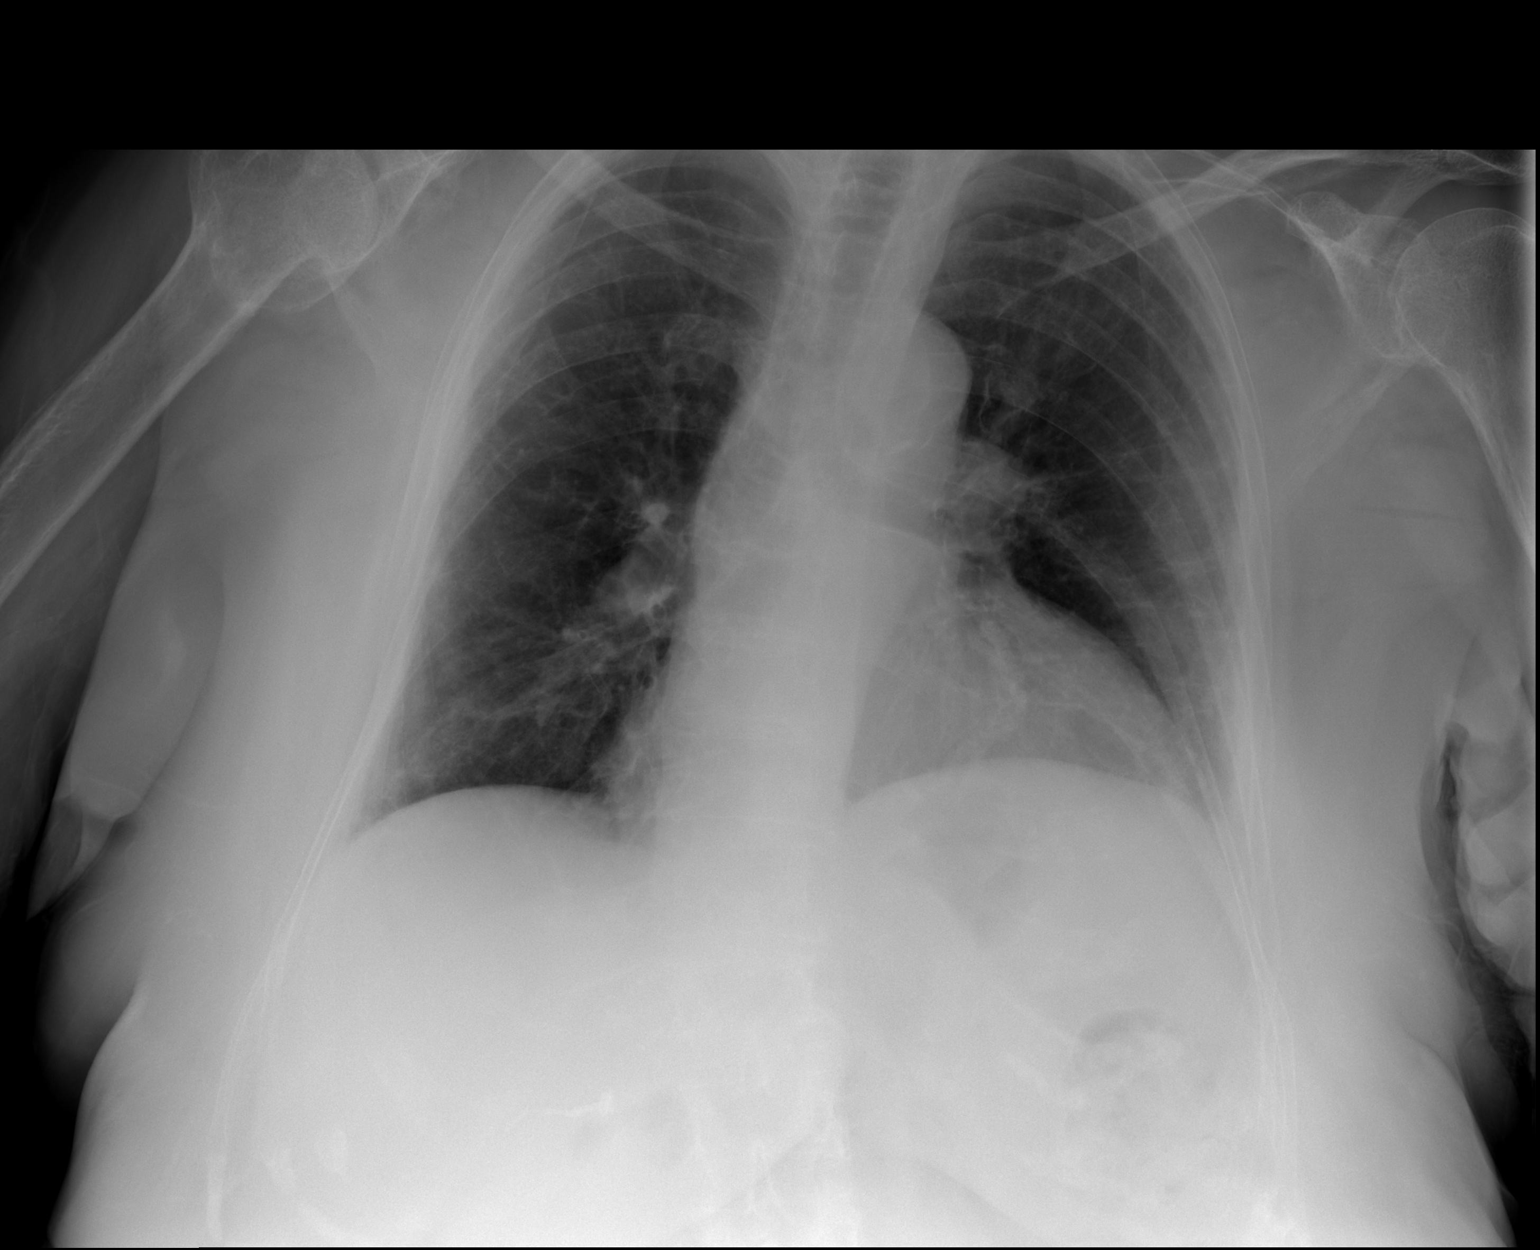

[2 of 2 positions shown; findings below may reference images not displayed]

FINDINGS: Normal heart size. There is no pleural effusion or edema. No
airspace consolidation. Coarsened interstitial markings are present
within the lungs.
IMPRESSION: 1. No acute findings.
2. Chronic interstitial coarsening noted.
